# Patient Record
Sex: Female | Born: 1970 | Hispanic: Yes | Marital: Single | Smoking: Never smoker
Health system: Southern US, Community
[De-identification: ages and names within clinical notes are randomized; demographics above are authoritative.]

## PROBLEM LIST (undated history)

## (undated) DIAGNOSIS — E7800 Pure hypercholesterolemia, unspecified: Secondary | ICD-10-CM

## (undated) DIAGNOSIS — J302 Other seasonal allergic rhinitis: Secondary | ICD-10-CM

## (undated) HISTORY — PX: EYE SURGERY: SHX253

---

## 2019-01-22 ENCOUNTER — Ambulatory Visit: Payer: Self-pay | Attending: Nurse Practitioner | Admitting: Nurse Practitioner

## 2019-01-22 ENCOUNTER — Other Ambulatory Visit: Payer: Self-pay

## 2019-01-22 NOTE — Progress Notes (Signed)
1347hrs Nurse called the patient's home phone number through the interpreter service, interpreter [phone] but received no answer and message was left on the voicemail for the patient to call back.  Return phone number given.       1357hrs Nurse called the patient's home phone number through the interpreter service, interpreter [phone] but received no answer and message was left on the voicemail for the patient to call back.  Return phone number given.

## 2019-01-23 NOTE — Progress Notes (Signed)
This encounter was created in error - please disregard.

## 2020-02-03 DIAGNOSIS — F32A Depression, unspecified: Secondary | ICD-10-CM | POA: Insufficient documentation

## 2020-06-05 DIAGNOSIS — Z780 Asymptomatic menopausal state: Secondary | ICD-10-CM | POA: Insufficient documentation

## 2020-06-05 DIAGNOSIS — F418 Other specified anxiety disorders: Secondary | ICD-10-CM | POA: Insufficient documentation

## 2020-06-05 DIAGNOSIS — E785 Hyperlipidemia, unspecified: Secondary | ICD-10-CM | POA: Insufficient documentation

## 2020-08-21 ENCOUNTER — Ambulatory Visit: Payer: Self-pay | Admitting: Physician Assistant

## 2020-09-11 ENCOUNTER — Ambulatory Visit: Payer: Self-pay | Admitting: Family Medicine

## 2020-10-07 ENCOUNTER — Ambulatory Visit: Payer: Self-pay | Admitting: Critical Care Medicine

## 2020-10-07 NOTE — Progress Notes (Deleted)
   Subjective:    Patient ID: Nancy Melton, female    DOB: November 09, 1970, 50 y.o.   MRN: [id number]  50 y.o.M here to est PCP      Review of Systems     Objective:   Physical Exam        Assessment & Plan:

## 2020-12-16 ENCOUNTER — Other Ambulatory Visit: Payer: Self-pay | Admitting: Physician Assistant

## 2020-12-16 DIAGNOSIS — Z1231 Encounter for screening mammogram for malignant neoplasm of breast: Secondary | ICD-10-CM

## 2020-12-17 ENCOUNTER — Other Ambulatory Visit: Payer: Self-pay

## 2020-12-17 ENCOUNTER — Other Ambulatory Visit: Payer: Self-pay | Admitting: Physician Assistant

## 2020-12-17 DIAGNOSIS — Z1231 Encounter for screening mammogram for malignant neoplasm of breast: Secondary | ICD-10-CM

## 2020-12-18 ENCOUNTER — Other Ambulatory Visit: Payer: Self-pay | Admitting: *Deleted

## 2020-12-18 DIAGNOSIS — Z1231 Encounter for screening mammogram for malignant neoplasm of breast: Secondary | ICD-10-CM

## 2021-06-17 ENCOUNTER — Other Ambulatory Visit: Payer: Self-pay | Admitting: Physician Assistant

## 2021-06-17 DIAGNOSIS — Z1231 Encounter for screening mammogram for malignant neoplasm of breast: Secondary | ICD-10-CM

## 2021-07-07 ENCOUNTER — Other Ambulatory Visit: Payer: Self-pay

## 2021-07-07 ENCOUNTER — Ambulatory Visit
Admission: RE | Admit: 2021-07-07 | Discharge: 2021-07-07 | Disposition: A | Discharge status: Home / Self Care | Payer: No Typology Code available for payment source | Source: Ambulatory Visit | Attending: Physician Assistant | Admitting: Physician Assistant

## 2021-07-07 DIAGNOSIS — Z1231 Encounter for screening mammogram for malignant neoplasm of breast: Secondary | ICD-10-CM

## 2021-07-09 ENCOUNTER — Other Ambulatory Visit: Payer: Self-pay | Admitting: Obstetrics and Gynecology

## 2021-07-09 DIAGNOSIS — R928 Other abnormal and inconclusive findings on diagnostic imaging of breast: Secondary | ICD-10-CM

## 2021-07-28 ENCOUNTER — Ambulatory Visit: Payer: Self-pay | Admitting: *Deleted

## 2021-07-28 ENCOUNTER — Other Ambulatory Visit: Payer: Self-pay | Admitting: Obstetrics and Gynecology

## 2021-07-28 ENCOUNTER — Ambulatory Visit
Admission: RE | Admit: 2021-07-28 | Discharge: 2021-07-28 | Disposition: A | Discharge status: Home / Self Care | Payer: No Typology Code available for payment source | Source: Ambulatory Visit | Attending: Obstetrics and Gynecology | Admitting: Obstetrics and Gynecology

## 2021-07-28 ENCOUNTER — Other Ambulatory Visit: Payer: Self-pay

## 2021-07-28 VITALS — BP 122/70 | Wt 164.2 lb

## 2021-07-28 DIAGNOSIS — R928 Other abnormal and inconclusive findings on diagnostic imaging of breast: Secondary | ICD-10-CM

## 2021-07-28 DIAGNOSIS — N6320 Unspecified lump in the left breast, unspecified quadrant: Secondary | ICD-10-CM

## 2021-07-28 DIAGNOSIS — Z1211 Encounter for screening for malignant neoplasm of colon: Secondary | ICD-10-CM

## 2021-07-28 DIAGNOSIS — Z01419 Encounter for gynecological examination (general) (routine) without abnormal findings: Secondary | ICD-10-CM

## 2021-07-28 NOTE — Progress Notes (Signed)
[name] is a 50 y.o. No obstetric history on file. female who presents to [facility] clinic today with complaint of left upper breast pain x 1 year that occurs 1-2 times per month. Patient rates the pain at a 4 out of 10. Patient had a screening mammogram completed 07/07/2021 that additional imaging of the left breast is recommended for follow-up.    Pap Smear: Pap smear completed today. Last Pap smear was 4 years ago at [facility] clinic and was normal per patient. Per patient has no history of an abnormal Pap smear. Last Pap smear result is not available in Epic.   Physical exam: Breasts Breasts symmetrical. No skin abnormalities bilateral breasts. No nipple retraction bilateral breasts. No nipple discharge bilateral breasts. No lymphadenopathy. No lumps palpated bilateral breasts. No complaints of pain or tenderness on exam.    MM 3D SCREEN BREAST BILATERAL  Result Date: 07/08/2021 CLINICAL DATA:  Screening. EXAM: DIGITAL SCREENING BILATERAL MAMMOGRAM WITH TOMOSYNTHESIS AND CAD TECHNIQUE: Bilateral screening digital craniocaudal and mediolateral oblique mammograms were obtained. Bilateral screening digital breast tomosynthesis was performed. The images were evaluated with computer-aided detection. COMPARISON:  None. ACR Breast Density Category b: There are scattered areas of fibroglandular density. FINDINGS: In the left breast, possible masses warrants further evaluation. In the right breast, no findings suspicious for malignancy. IMPRESSION: Further evaluation is suggested for possible masses in the left breast. RECOMMENDATION: Diagnostic mammogram and possibly ultrasound of the left breast. (Code:[id number]) The patient will be contacted regarding the findings, and additional imaging will be scheduled. BI-RADS CATEGORY  0: Incomplete. Need additional imaging evaluation and/or prior mammograms for comparison. Electronically Signed   By: Nancy Melton M.D.   On:  07/08/2021 11:37    Pelvic/Bimanual Ext Genitalia No lesions, no swelling and no discharge observed on external genitalia.        Vagina Vagina pink and normal texture. No lesions or discharge observed in vagina.        Cervix Cervix is present. Cervix pink and of normal texture. No discharge observed.    Uterus Uterus is present and palpable. Uterus in normal position and normal size.        Adnexae Bilateral ovaries present and palpable. No tenderness on palpation.         Rectovaginal No rectal exam completed today since patient had no rectal complaints. No skin abnormalities observed on exam.     Smoking History: Patient has never smoked.   Patient Navigation: Patient education provided. Access to services provided for patient through [place] program. Spanish interpreter Nancy Melton from [facility] provided.   Colorectal Cancer Screening: Per patient has never had colonoscopy completed. FIT Test given to patient to complete. No complaints today.    Breast and Cervical Cancer Risk Assessment: Patient does not have family history of breast cancer, known genetic mutations, or radiation treatment to the chest before age 17. Patient does not have history of cervical dysplasia, immunocompromised, or DES exposure in-utero.  Risk Assessment     Risk Scores       07/28/2021   Last edited by: Nancy Rutherford, LPN   5-year risk: 0.8 %   Lifetime risk: 7.8 %            A: BCCCP exam with pap smear Complaint of left breast pain.  P: Referred patient to the [facility] Center of [facility] for a left breast diagnostic mammogram per recommendation. Appointment scheduled Tuesday, July 28, 2021 at 1450.  Nancy Melton, Nancy Grippe,  RN 07/28/2021 12:04 PM

## 2021-07-28 NOTE — Patient Instructions (Addendum)
Explained breast self awareness with Blinda Leatherwood. Pap smear completed today. Let her know BCCCP will cover Pap smears and HPV typing every 5 years unless has a history of abnormal Pap smears. Referred patient to the [facility] of [facility] for a left breast diagnostic mammogram per recommendation. Appointment scheduled Tuesday, July 28, 2021 at 1450. Patient aware of appointment and will be there. Let patient know will follow up with her within the next couple weeks with results of Pap smear by phone. Presley Blasdell verbalized understanding.  Rickita Forstner, Kathaleen Maser, RN 12:04 PM

## 2021-08-03 LAB — CYTOLOGY - PAP
Comment: NEGATIVE
Diagnosis: NEGATIVE
High risk HPV: NEGATIVE

## 2021-08-05 ENCOUNTER — Telehealth: Payer: Self-pay

## 2021-08-05 ENCOUNTER — Ambulatory Visit: Payer: Self-pay

## 2021-08-05 NOTE — Telephone Encounter (Signed)
Via Erika McReynolds, Spanish Interpreter ([employer]), Patient informed negative Pap/HPV results, next pap due in 5 years. Patient verbalized understanding.  

## 2021-08-06 ENCOUNTER — Ambulatory Visit
Admission: RE | Admit: 2021-08-06 | Discharge: 2021-08-06 | Disposition: A | Discharge status: Home / Self Care | Payer: No Typology Code available for payment source | Source: Ambulatory Visit | Attending: Obstetrics and Gynecology | Admitting: Obstetrics and Gynecology

## 2021-08-06 DIAGNOSIS — N6320 Unspecified lump in the left breast, unspecified quadrant: Secondary | ICD-10-CM

## 2021-08-06 HISTORY — PX: BREAST BIOPSY: SHX20

## 2021-08-27 ENCOUNTER — Ambulatory Visit: Payer: No Typology Code available for payment source

## 2021-10-28 ENCOUNTER — Ambulatory Visit: Payer: Self-pay | Admitting: Podiatry

## 2021-10-29 ENCOUNTER — Other Ambulatory Visit: Payer: Self-pay

## 2021-10-29 ENCOUNTER — Encounter: Payer: Self-pay | Admitting: Podiatry

## 2021-10-29 ENCOUNTER — Ambulatory Visit (INDEPENDENT_AMBULATORY_CARE_PROVIDER_SITE_OTHER): Payer: Self-pay | Admitting: Podiatry

## 2021-10-29 ENCOUNTER — Ambulatory Visit (INDEPENDENT_AMBULATORY_CARE_PROVIDER_SITE_OTHER): Payer: Self-pay

## 2021-10-29 DIAGNOSIS — M7750 Other enthesopathy of unspecified foot: Secondary | ICD-10-CM

## 2021-10-29 DIAGNOSIS — M722 Plantar fascial fibromatosis: Secondary | ICD-10-CM

## 2021-10-29 MED ORDER — MELOXICAM 15 MG PO TABS: 15.0000 mg | ORAL_TABLET | Freq: Every day | ORAL | 3 refills | Status: DC

## 2021-10-29 NOTE — Progress Notes (Signed)
°  Subjective:  Patient ID: Nancy Melton, female    DOB: [birth year],  MRN: [id number]  Chief Complaint  Patient presents with   Foot Pain       (np) right foot pain while walking*self pay    51 y.o. female presents with the above complaint. History confirmed with patient.  Started about 2 months ago its been in the arch and heel.  She works on her feet and this has made it worse.  The left just are not hurting a week or 2 ago because she is offloading onto the other foot.  She is taken naproxen but it did not help.  No professional treatment thus far  Objective:  Physical Exam: warm, good capillary refill, no trophic changes or ulcerative lesions, normal DP and PT pulses, and normal sensory exam. Left Foot: point tenderness over the heel pad Right Foot: point tenderness over the heel pad  No images are attached to the encounter.  Radiographs: Multiple views x-ray of the right foot: no fracture, dislocation, swelling or degenerative changes noted and plantar calcaneal spur Assessment:   1. Plantar fasciitis, bilateral      Plan:  Patient was evaluated and treated and all questions answered.  Discussed the etiology and treatment options for plantar fasciitis including stretching, formal physical therapy, supportive shoegears such as a running shoe or sneaker, pre fabricated orthoses, injection therapy, and oral medications. We also discussed the role of surgical treatment of this for patients who do not improve after exhausting non-surgical treatment options.   -XR reviewed with patient -Educated patient on stretching and icing of the affected limb -Injection delivered to the plantar fascia of the right foot. -Rx for meloxicam. Educated on use, risks and benefits of the medication -Advised if not improved by late April or early May to return for further injection and treatment  Return if symptoms worsen or fail to improve.

## 2021-10-29 NOTE — Patient Instructions (Addendum)

## 2021-11-09 DIAGNOSIS — M722 Plantar fascial fibromatosis: Secondary | ICD-10-CM | POA: Insufficient documentation

## 2022-06-24 ENCOUNTER — Other Ambulatory Visit: Payer: Self-pay

## 2022-06-24 DIAGNOSIS — Z1231 Encounter for screening mammogram for malignant neoplasm of breast: Secondary | ICD-10-CM

## 2022-08-03 ENCOUNTER — Ambulatory Visit: Payer: Self-pay | Admitting: *Deleted

## 2022-08-03 ENCOUNTER — Ambulatory Visit
Admission: RE | Admit: 2022-08-03 | Discharge: 2022-08-03 | Disposition: A | Discharge status: Home / Self Care | Payer: No Typology Code available for payment source | Source: Ambulatory Visit | Attending: Obstetrics and Gynecology | Admitting: Obstetrics and Gynecology

## 2022-08-03 VITALS — BP 119/80 | Ht 63.0 in | Wt 149.0 lb

## 2022-08-03 DIAGNOSIS — Z1239 Encounter for other screening for malignant neoplasm of breast: Secondary | ICD-10-CM

## 2022-08-03 DIAGNOSIS — Z1231 Encounter for screening mammogram for malignant neoplasm of breast: Secondary | ICD-10-CM

## 2022-08-03 DIAGNOSIS — Z1211 Encounter for screening for malignant neoplasm of colon: Secondary | ICD-10-CM

## 2022-08-03 NOTE — Patient Instructions (Signed)
Explained breast self awareness with Blinda Leatherwood. Patient did not need a Pap smear today due to last Pap smear and HPV typing was 07/28/2021. Let her know BCCCP will cover Pap smears and HPV typing every 5 years unless has a history of abnormal Pap smears. Referred patient to the Breast Center of [facility] for a screening mammogram on mobile unit. Appointment scheduled Tuesday, August 03, 2022 at 1500. Patient aware of appointment and will be there. Let patient know the Breast Center will follow up with her within the next couple weeks with results of her mammogram by letter or phone. Aren [place] verbalized understanding.  Tashan Kreitzer, Kathaleen Maser, RN 2:59 PM

## 2022-08-03 NOTE — Progress Notes (Signed)
Nancy Melton is a 51 y.o. female who presents to [facility] clinic today with no complaints.    Pap Smear: Pap smear not completed today. Last Pap smear was 07/28/2021 at [facility] clinic and was normal with negative HPV. Per patient has no history of an abnormal Pap smear. Last Pap smear result is available in Epic.   Physical exam: Breasts Breasts symmetrical. No skin abnormalities bilateral breasts. No nipple retraction bilateral breasts. No nipple discharge bilateral breasts. No lymphadenopathy. No lumps palpated bilateral breasts. No complaints of pain or tenderness on exam.     MM CLIP PLACEMENT LEFT  Result Date: 08/06/2021 CLINICAL DATA:  Evaluate biopsy marker EXAM: 3D DIAGNOSTIC LEFT MAMMOGRAM POST ULTRASOUND BIOPSY COMPARISON:  Previous exam(s). FINDINGS: 3D Mammographic images were obtained following ultrasound guided biopsy of an 11 o'clock left breast mass. The biopsy marking clip is in expected position at the site of biopsy. IMPRESSION: Appropriate positioning of the ribbon shaped biopsy marking clip at the site of biopsy in the biopsied 11 o'clock left breast mass. Final Assessment: Post Procedure Mammograms for Marker Placement Electronically Signed   By: Gerome Sam III M.D.   On: 08/06/2021 10:47  MM DIAG BREAST TOMO UNI LEFT  Result Date: 07/28/2021 CLINICAL DATA:  Patient returns today to evaluate 2 possible LEFT breast masses identified on a recent baseline screening mammogram. EXAM: DIGITAL DIAGNOSTIC UNILATERAL LEFT MAMMOGRAM WITH TOMOSYNTHESIS AND CAD; ULTRASOUND LEFT BREAST LIMITED TECHNIQUE: Left digital diagnostic mammography and breast tomosynthesis was performed. The images were evaluated with computer-aided detection.; Targeted ultrasound examination of the left breast was performed. COMPARISON:  Baseline screening mammogram dated 07/07/2021. ACR Breast Density Category b: There are scattered areas of fibroglandular density. FINDINGS: On today's additional  diagnostic views with spot compression and 3D tomosynthesis, an oval circumscribed mass is confirmed within the inner LEFT breast which measures approximately 1 cm greatest dimension. There is an additional partially obscured mass confirmed within the outer LEFT breast which measures approximately 8 mm greatest dimension. Targeted ultrasound is performed, showing an irregular hypoechoic mass in the LEFT breast at the 11 o'clock axis, 3 cm from the nipple, measuring 1.3 x 0.5 x 0.5 cm, without internal vascularity, corresponding to the mammographic finding. Targeted ultrasound also shows a benign cluster of cysts in the LEFT breast at the 3 o'clock axis, 3 cm from the nipple, measuring 6 x 4 x 5 mm, corresponding to the other mammographic finding. LEFT axilla was also evaluated with ultrasound showing no enlarged or morphologically abnormal lymph nodes. IMPRESSION: 1. Indeterminate hypoechoic mass in the LEFT breast at the 11 o'clock axis, 3 cm from nipple, measuring 1.3 cm, corresponding to the mammographic findings. This may represent a benign cluster of complicated cysts. Ultrasound-guided biopsy is recommended to exclude malignancy. 2. Benign cluster of cysts in the LEFT breast at the 3 o'clock axis, 3 cm from the nipple, measuring 6 mm, corresponding to the other mammographic finding. RECOMMENDATION: Ultrasound-guided biopsy for the LEFT breast mass at the 11 o'clock axis, 3 cm from the nipple, measuring 1.3 cm. Ultrasound-guided biopsy is scheduled for December 1st. I have discussed the findings and recommendations with the patient with the aid of an interpreter. If applicable, a reminder letter will be sent to the patient regarding the next appointment. BI-RADS CATEGORY  4: Suspicious. Electronically Signed   By: Bary Richard M.D.   On: 07/28/2021 16:11  MM 3D SCREEN BREAST BILATERAL  Result Date: 07/08/2021 CLINICAL DATA:  Screening. EXAM: DIGITAL SCREENING BILATERAL MAMMOGRAM WITH  TOMOSYNTHESIS AND CAD  TECHNIQUE: Bilateral screening digital craniocaudal and mediolateral oblique mammograms were obtained. Bilateral screening digital breast tomosynthesis was performed. The images were evaluated with computer-aided detection. COMPARISON:  None. ACR Breast Density Category b: There are scattered areas of fibroglandular density. FINDINGS: In the left breast, possible masses warrants further evaluation. In the right breast, no findings suspicious for malignancy. IMPRESSION: Further evaluation is suggested for possible masses in the left breast. RECOMMENDATION: Diagnostic mammogram and possibly ultrasound of the left breast. (Code:[id number]) The patient will be contacted regarding the findings, and additional imaging will be scheduled. BI-RADS CATEGORY  0: Incomplete. Need additional imaging evaluation and/or prior mammograms for comparison. Electronically Signed   By: Baird Lyons M.D.   On: 07/08/2021 11:37        Pelvic/Bimanual Pap is not indicated today per BCCCP guidelines.   Smoking History: Patient has never smoked.   Patient Navigation: Patient education provided. Access to services provided for patient through [place] program. Spanish interpreter [name] from [facility] provided.   Colorectal Cancer Screening: Per patient has never had colonoscopy completed. FIT Test given to patient to complete. No complaints today.    Breast and Cervical Cancer Risk Assessment: Patient does not have family history of breast cancer, known genetic mutations, or radiation treatment to the chest before age 20. Patient does not have history of cervical dysplasia, immunocompromised, or DES exposure in-utero.  Risk Scores as of 08/03/2022     [name]           5-year 1.18 %   Lifetime 10.82 %   This patient is [place] but has no documented birth country, so the [place] model used data from [place] patients to calculate their risk score. Document a birth country in the Demographics activity for a more accurate  score.         Last calculated by Caprice Red, CMA on 08/03/2022 at  2:57 PM        A: BCCCP exam without pap smear No complaints.  P: Referred patient to the [facility] Center of [facility] for a screening mammogram on mobile unit. Appointment scheduled Tuesday, August 03, 2022 at 1500.  Priscille Heidelberg, RN 08/03/2022 2:59 PM

## 2022-08-05 ENCOUNTER — Other Ambulatory Visit: Payer: Self-pay | Admitting: Obstetrics and Gynecology

## 2022-08-05 DIAGNOSIS — R928 Other abnormal and inconclusive findings on diagnostic imaging of breast: Secondary | ICD-10-CM

## 2022-08-16 ENCOUNTER — Other Ambulatory Visit: Payer: No Typology Code available for payment source

## 2022-08-26 ENCOUNTER — Other Ambulatory Visit: Payer: Self-pay | Admitting: Obstetrics and Gynecology

## 2022-08-26 ENCOUNTER — Ambulatory Visit
Admission: RE | Admit: 2022-08-26 | Discharge: 2022-08-26 | Disposition: A | Discharge status: Home / Self Care | Payer: No Typology Code available for payment source | Source: Ambulatory Visit | Attending: Obstetrics and Gynecology | Admitting: Obstetrics and Gynecology

## 2022-08-26 DIAGNOSIS — R928 Other abnormal and inconclusive findings on diagnostic imaging of breast: Secondary | ICD-10-CM

## 2022-09-08 ENCOUNTER — Ambulatory Visit
Admission: RE | Admit: 2022-09-08 | Discharge: 2022-09-08 | Disposition: A | Discharge status: Home / Self Care | Payer: No Typology Code available for payment source | Source: Ambulatory Visit | Attending: Obstetrics and Gynecology | Admitting: Obstetrics and Gynecology

## 2022-09-08 DIAGNOSIS — R928 Other abnormal and inconclusive findings on diagnostic imaging of breast: Secondary | ICD-10-CM

## 2022-09-08 HISTORY — PX: BREAST BIOPSY: SHX20

## 2022-09-22 ENCOUNTER — Ambulatory Visit: Payer: Self-pay | Admitting: Surgery

## 2022-09-22 DIAGNOSIS — N6489 Other specified disorders of breast: Secondary | ICD-10-CM

## 2022-10-05 ENCOUNTER — Other Ambulatory Visit: Payer: Self-pay | Admitting: Surgery

## 2022-10-05 DIAGNOSIS — N6489 Other specified disorders of breast: Secondary | ICD-10-CM

## 2022-12-06 ENCOUNTER — Encounter (HOSPITAL_BASED_OUTPATIENT_CLINIC_OR_DEPARTMENT_OTHER): Payer: Self-pay | Admitting: Surgery

## 2022-12-06 ENCOUNTER — Other Ambulatory Visit: Payer: Self-pay

## 2022-12-09 NOTE — Progress Notes (Signed)

## 2022-12-13 ENCOUNTER — Ambulatory Visit
Admission: RE | Admit: 2022-12-13 | Discharge: 2022-12-13 | Disposition: A | Discharge status: Home / Self Care | Payer: No Typology Code available for payment source | Source: Ambulatory Visit | Attending: Surgery | Admitting: Surgery

## 2022-12-13 DIAGNOSIS — N6489 Other specified disorders of breast: Secondary | ICD-10-CM

## 2022-12-13 HISTORY — PX: BREAST BIOPSY: SHX20

## 2022-12-14 ENCOUNTER — Other Ambulatory Visit: Payer: Self-pay

## 2022-12-14 ENCOUNTER — Ambulatory Visit
Admission: RE | Admit: 2022-12-14 | Discharge: 2022-12-14 | Disposition: A | Discharge status: Home / Self Care | Payer: No Typology Code available for payment source | Source: Ambulatory Visit | Attending: Surgery | Admitting: Surgery

## 2022-12-14 ENCOUNTER — Encounter (HOSPITAL_BASED_OUTPATIENT_CLINIC_OR_DEPARTMENT_OTHER)
Admission: RE | Disposition: A | Discharge status: Home / Self Care | Payer: Self-pay | Source: Home / Self Care | Attending: Surgery

## 2022-12-14 ENCOUNTER — Ambulatory Visit (HOSPITAL_BASED_OUTPATIENT_CLINIC_OR_DEPARTMENT_OTHER): Payer: No Typology Code available for payment source | Admitting: Anesthesiology

## 2022-12-14 ENCOUNTER — Ambulatory Visit (HOSPITAL_BASED_OUTPATIENT_CLINIC_OR_DEPARTMENT_OTHER)
Admission: RE | Admit: 2022-12-14 | Discharge: 2022-12-14 | Disposition: A | Discharge status: Home / Self Care | Payer: No Typology Code available for payment source | Attending: Surgery | Admitting: Surgery

## 2022-12-14 DIAGNOSIS — F32A Depression, unspecified: Secondary | ICD-10-CM | POA: Insufficient documentation

## 2022-12-14 DIAGNOSIS — L905 Scar conditions and fibrosis of skin: Secondary | ICD-10-CM | POA: Insufficient documentation

## 2022-12-14 DIAGNOSIS — N6489 Other specified disorders of breast: Secondary | ICD-10-CM

## 2022-12-14 DIAGNOSIS — F419 Anxiety disorder, unspecified: Secondary | ICD-10-CM | POA: Insufficient documentation

## 2022-12-14 DIAGNOSIS — E7800 Pure hypercholesterolemia, unspecified: Secondary | ICD-10-CM | POA: Insufficient documentation

## 2022-12-14 HISTORY — DX: Other seasonal allergic rhinitis: J30.2

## 2022-12-14 HISTORY — DX: Pure hypercholesterolemia, unspecified: E78.00

## 2022-12-14 HISTORY — PX: BREAST LUMPECTOMY WITH RADIOACTIVE SEED LOCALIZATION: SHX6424

## 2022-12-14 SURGERY — BREAST LUMPECTOMY WITH RADIOACTIVE SEED LOCALIZATION
Anesthesia: General | Site: Breast | Laterality: Right

## 2022-12-14 MED ORDER — ACETAMINOPHEN 500 MG PO TABS
ORAL_TABLET | ORAL | Status: AC
  Filled 2022-12-14: qty 2

## 2022-12-14 MED ORDER — BUPIVACAINE HCL (PF) 0.25 % IJ SOLN
INTRAMUSCULAR | Status: DC | PRN
  Administered 2022-12-14: 20 mL

## 2022-12-14 MED ORDER — MIDAZOLAM HCL 2 MG/2ML IJ SOLN
INTRAMUSCULAR | Status: AC
  Filled 2022-12-14: qty 2

## 2022-12-14 MED ORDER — LIDOCAINE HCL (CARDIAC) PF 100 MG/5ML IV SOSY
PREFILLED_SYRINGE | INTRAVENOUS | Status: DC | PRN
  Administered 2022-12-14: 60 mg via INTRATRACHEAL

## 2022-12-14 MED ORDER — MIDAZOLAM HCL 5 MG/5ML IJ SOLN
INTRAMUSCULAR | Status: DC | PRN
  Administered 2022-12-14: 2 mg via INTRAVENOUS

## 2022-12-14 MED ORDER — LIDOCAINE 2% (20 MG/ML) 5 ML SYRINGE
INTRAMUSCULAR | Status: AC
  Filled 2022-12-14: qty 5

## 2022-12-14 MED ORDER — BUPIVACAINE HCL (PF) 0.25 % IJ SOLN
INTRAMUSCULAR | Status: AC
  Filled 2022-12-14: qty 120

## 2022-12-14 MED ORDER — HYDROMORPHONE HCL 1 MG/ML IJ SOLN: 0.2500 mg | INTRAMUSCULAR | Status: DC | PRN

## 2022-12-14 MED ORDER — KETOROLAC TROMETHAMINE 30 MG/ML IJ SOLN
INTRAMUSCULAR | Status: DC | PRN
  Administered 2022-12-14: 30 mg via INTRAVENOUS

## 2022-12-14 MED ORDER — ONDANSETRON HCL 4 MG/2ML IJ SOLN
INTRAMUSCULAR | Status: AC
  Filled 2022-12-14: qty 2

## 2022-12-14 MED ORDER — ONDANSETRON HCL 4 MG/2ML IJ SOLN
INTRAMUSCULAR | Status: DC | PRN
  Administered 2022-12-14: 4 mg via INTRAVENOUS

## 2022-12-14 MED ORDER — FENTANYL CITRATE (PF) 100 MCG/2ML IJ SOLN
INTRAMUSCULAR | Status: AC
  Filled 2022-12-14: qty 2

## 2022-12-14 MED ORDER — LACTATED RINGERS IV SOLN: INTRAVENOUS | Status: DC

## 2022-12-14 MED ORDER — SODIUM CHLORIDE 0.9 % IV SOLN
INTRAVENOUS | Status: AC
  Filled 2022-12-14 (×3): qty 10

## 2022-12-14 MED ORDER — DEXAMETHASONE SODIUM PHOSPHATE 10 MG/ML IJ SOLN
INTRAMUSCULAR | Status: AC
  Filled 2022-12-14: qty 1

## 2022-12-14 MED ORDER — SODIUM CHLORIDE 0.9 % IV SOLN
INTRAVENOUS | Status: DC | PRN
  Administered 2022-12-14: 500 mL

## 2022-12-14 MED ORDER — CEFAZOLIN SODIUM-DEXTROSE 2-4 GM/100ML-% IV SOLN
INTRAVENOUS | Status: AC
  Filled 2022-12-14: qty 100

## 2022-12-14 MED ORDER — ACETAMINOPHEN 500 MG PO TABS
1000.0000 mg | ORAL_TABLET | ORAL | Status: AC
  Administered 2022-12-14: 1000 mg via ORAL

## 2022-12-14 MED ORDER — PROPOFOL 10 MG/ML IV BOLUS
INTRAVENOUS | Status: AC
  Filled 2022-12-14: qty 20

## 2022-12-14 MED ORDER — FENTANYL CITRATE (PF) 100 MCG/2ML IJ SOLN
INTRAMUSCULAR | Status: DC | PRN
  Administered 2022-12-14: 25 ug via INTRAVENOUS
  Administered 2022-12-14: 50 ug via INTRAVENOUS
  Administered 2022-12-14: 25 ug via INTRAVENOUS

## 2022-12-14 MED ORDER — PROPOFOL 10 MG/ML IV BOLUS
INTRAVENOUS | Status: DC | PRN
  Administered 2022-12-14: 150 mg via INTRAVENOUS

## 2022-12-14 MED ORDER — CHLORHEXIDINE GLUCONATE CLOTH 2 % EX PADS: 6.0000 | MEDICATED_PAD | Freq: Once | CUTANEOUS | Status: DC

## 2022-12-14 MED ORDER — BUPIVACAINE-EPINEPHRINE (PF) 0.25% -1:200000 IJ SOLN: INTRAMUSCULAR | Status: DC | PRN

## 2022-12-14 MED ORDER — DEXAMETHASONE SODIUM PHOSPHATE 10 MG/ML IJ SOLN
INTRAMUSCULAR | Status: DC | PRN
  Administered 2022-12-14: 4 mg via INTRAVENOUS

## 2022-12-14 MED ORDER — CEFAZOLIN SODIUM-DEXTROSE 2-4 GM/100ML-% IV SOLN
2.0000 g | INTRAVENOUS | Status: AC
  Administered 2022-12-14: 2 g via INTRAVENOUS

## 2022-12-14 SURGICAL SUPPLY — 54 items
ADH SKN CLS APL DERMABOND .7 (GAUZE/BANDAGES/DRESSINGS) ×1
APL PRP STRL LF DISP 70% ISPRP (MISCELLANEOUS) ×1
APPLIER CLIP 9.375 MED OPEN (MISCELLANEOUS)
APR CLP MED 9.3 20 MLT OPN (MISCELLANEOUS)
BINDER BREAST LRG (GAUZE/BANDAGES/DRESSINGS) IMPLANT
BINDER BREAST MEDIUM (GAUZE/BANDAGES/DRESSINGS) IMPLANT
BINDER BREAST XLRG (GAUZE/BANDAGES/DRESSINGS) IMPLANT
BINDER BREAST XXLRG (GAUZE/BANDAGES/DRESSINGS) IMPLANT
BLADE SURG 15 STRL LF DISP TIS (BLADE) ×1 IMPLANT
BLADE SURG 15 STRL SS (BLADE) ×1
CANISTER SUC SOCK COL 7IN (MISCELLANEOUS) IMPLANT
CANISTER SUCT 1200ML W/VALVE (MISCELLANEOUS) IMPLANT
CHLORAPREP W/TINT 26 (MISCELLANEOUS) ×1 IMPLANT
CLIP APPLIE 9.375 MED OPEN (MISCELLANEOUS) IMPLANT
COVER BACK TABLE 60X90IN (DRAPES) ×1 IMPLANT
COVER MAYO STAND STRL (DRAPES) ×1 IMPLANT
COVER PROBE CYLINDRICAL 5X96 (MISCELLANEOUS) ×1 IMPLANT
DERMABOND ADVANCED .7 DNX12 (GAUZE/BANDAGES/DRESSINGS) ×1 IMPLANT
DRAPE LAPAROSCOPIC ABDOMINAL (DRAPES) IMPLANT
DRAPE LAPAROTOMY 100X72 PEDS (DRAPES) ×1 IMPLANT
DRAPE UTILITY XL STRL (DRAPES) ×1 IMPLANT
ELECT COATED BLADE 2.86 ST (ELECTRODE) ×1 IMPLANT
ELECT REM PT RETURN 9FT ADLT (ELECTROSURGICAL) ×1
ELECTRODE REM PT RTRN 9FT ADLT (ELECTROSURGICAL) ×1 IMPLANT
GLOVE BIOGEL PI IND STRL 6.5 (GLOVE) IMPLANT
GLOVE BIOGEL PI IND STRL 7.0 (GLOVE) IMPLANT
GLOVE BIOGEL PI IND STRL 8 (GLOVE) ×1 IMPLANT
GLOVE ECLIPSE 8.0 STRL XLNG CF (GLOVE) ×1 IMPLANT
GLOVE SURG SS PI 6.5 STRL IVOR (GLOVE) IMPLANT
GLOVE SURG SYN 8.0 (GLOVE) ×1 IMPLANT
GLOVE SURG SYN 8.0 PF PI (GLOVE) IMPLANT
GOWN STRL REUS W/ TWL LRG LVL3 (GOWN DISPOSABLE) ×2 IMPLANT
GOWN STRL REUS W/ TWL XL LVL3 (GOWN DISPOSABLE) ×1 IMPLANT
GOWN STRL REUS W/TWL LRG LVL3 (GOWN DISPOSABLE) ×1
GOWN STRL REUS W/TWL XL LVL3 (GOWN DISPOSABLE) ×1
HEMOSTAT ARISTA ABSORB 3G PWDR (HEMOSTASIS) IMPLANT
HEMOSTAT SNOW SURGICEL 2X4 (HEMOSTASIS) IMPLANT
KIT MARKER MARGIN INK (KITS) ×1 IMPLANT
NDL HYPO 25X1 1.5 SAFETY (NEEDLE) ×1 IMPLANT
NEEDLE HYPO 25X1 1.5 SAFETY (NEEDLE) ×1 IMPLANT
NS IRRIG 1000ML POUR BTL (IV SOLUTION) ×1 IMPLANT
PACK BASIN DAY SURGERY FS (CUSTOM PROCEDURE TRAY) ×1 IMPLANT
PENCIL SMOKE EVACUATOR (MISCELLANEOUS) ×1 IMPLANT
SLEEVE SCD COMPRESS KNEE MED (STOCKING) ×1 IMPLANT
SPIKE FLUID TRANSFER (MISCELLANEOUS) IMPLANT
SPONGE T-LAP 4X18 ~~LOC~~+RFID (SPONGE) ×1 IMPLANT
SUT MNCRL AB 4-0 PS2 18 (SUTURE) ×1 IMPLANT
SUT SILK 2 0 SH (SUTURE) IMPLANT
SUT VICRYL 3-0 CR8 SH (SUTURE) ×1 IMPLANT
SYR CONTROL 10ML LL (SYRINGE) ×1 IMPLANT
TOWEL GREEN STERILE FF (TOWEL DISPOSABLE) ×1 IMPLANT
TRAY FAXITRON CT DISP (TRAY / TRAY PROCEDURE) ×1 IMPLANT
TUBE CONNECTING 20X1/4 (TUBING) IMPLANT
YANKAUER SUCT BULB TIP NO VENT (SUCTIONS) IMPLANT

## 2022-12-14 NOTE — Anesthesia Postprocedure Evaluation (Signed)
Anesthesia Post Note  Patient: Nancy Melton  Procedure(s) Performed: RIGHT BREAST LUMPECTOMY WITH RADIOACTIVE SEED LOCALIZATION (Right: Breast)     Patient location during evaluation: PACU Anesthesia Type: General Level of consciousness: awake and alert Pain management: pain level controlled Vital Signs Assessment: post-procedure vital signs reviewed and stable Respiratory status: spontaneous breathing, nonlabored ventilation and respiratory function stable Cardiovascular status: blood pressure returned to baseline and stable Postop Assessment: no apparent nausea or vomiting Anesthetic complications: no  No notable events documented.  Last Vitals:  Vitals:   12/14/22 1015 12/14/22 1030  BP: 123/75 130/64  Pulse: 76 71  Resp: 13 14  Temp:  (!) 36.3 C  SpO2: 97% 96%    Last Pain:  Vitals:   12/14/22 1030  TempSrc:   PainSc: 0-No pain                 Mellonie Guess,W. EDMOND

## 2022-12-14 NOTE — Discharge Instructions (Addendum)
[facility] Office Phone Number [phone]  BREAST BIOPSY/ PARTIAL MASTECTOMY: POST OP INSTRUCTIONS  Always review your discharge instruction sheet given to you by the facility where your surgery was performed.  IF YOU HAVE DISABILITY OR FAMILY LEAVE FORMS, YOU MUST BRING THEM TO THE OFFICE FOR PROCESSING.  DO NOT GIVE THEM TO YOUR DOCTOR.  A prescription for pain medication may be given to you upon discharge.  Take your pain medication as prescribed, if needed.  If narcotic pain medicine is not needed, then you may take acetaminophen (Tylenol) or ibuprofen (Advil) as needed. Take your usually prescribed medications unless otherwise directed If you need a refill on your pain medication, please contact your pharmacy.  They will contact our office to request authorization.  Prescriptions will not be filled after 5pm or on week-ends. You should eat very light the first 24 hours after surgery, such as soup, crackers, pudding, etc.  Resume your normal diet the day after surgery. Most patients will experience some swelling and bruising in the breast.  Ice packs and a good support bra will help.  Swelling and bruising can take several days to resolve.  It is common to experience some constipation if taking pain medication after surgery.  Increasing fluid intake and taking a stool softener will usually help or prevent this problem from occurring.  A mild laxative (Milk of Magnesia or Miralax) should be taken according to package directions if there are no bowel movements after 48 hours. Unless discharge instructions indicate otherwise, you may remove your bandages 24-48 hours after surgery, and you may shower at that time.  You may have steri-strips (small skin tapes) in place directly over the incision.  These strips should be left on the skin for 7-10 days.  If your surgeon used skin glue on the incision, you may shower in 24 hours.  The glue will flake off over the next 2-3 weeks.  Any  sutures or staples will be removed at the office during your follow-up visit. ACTIVITIES:  You may resume regular daily activities (gradually increasing) beginning the next day.  Wearing a good support bra or sports bra minimizes pain and swelling.  You may have sexual intercourse when it is comfortable. You may drive when you no longer are taking prescription pain medication, you can comfortably wear a seatbelt, and you can safely maneuver your car and apply brakes. RETURN TO WORK:  ______________________________________________________________________________________ Nancy Melton should see your doctor in the office for a follow-up appointment approximately two weeks after your surgery.  Your doctor's nurse will typically make your follow-up appointment when she calls you with your pathology report.  Expect your pathology report 2-3 business days after your surgery.  You may call to check if you do not hear from [place] after three days. OTHER INSTRUCTIONS: _______________________________________________________________________________________________ _____________________________________________________________________________________________________________________________________ _____________________________________________________________________________________________________________________________________ _____________________________________________________________________________________________________________________________________  WHEN TO CALL YOUR DOCTOR: Fever over 101.0 Nausea and/or vomiting. Extreme swelling or bruising. Continued bleeding from incision. Increased pain, redness, or drainage from the incision.  The clinic staff is available to answer your questions during regular business hours.  Please don't hesitate to call and ask to speak to one of the nurses for clinical concerns.  If you have a medical emergency, go to the nearest emergency room or call 911.  A surgeon from [facility] is always on call at the hospital.  For further questions, please visit [email or web address]    You may have Tylenol again after 1:30pm today, if needed.   You may have  Ibuprofen/NSAIDS after 5:30pm today, if needed.   Post Anesthesia Home Care Instructions  Activity: Get plenty of rest for the remainder of the day. A responsible individual must stay with you for 24 hours following the procedure.  For the next 24 hours, DO NOT: -Drive a car -Advertising copywriter -Drink alcoholic beverages -Take any medication unless instructed by your physician -Make any legal decisions or sign important papers.  Meals: Start with liquid foods such as gelatin or soup. Progress to regular foods as tolerated. Avoid greasy, spicy, heavy foods. If nausea and/or vomiting occur, drink only clear liquids until the nausea and/or vomiting subsides. Call your physician if vomiting continues.  Special Instructions/Symptoms: Your throat may feel dry or sore from the anesthesia or the breathing tube placed in your throat during surgery. If this causes discomfort, gargle with warm salt water. The discomfort should disappear within 24 hours.  If you had a scopolamine patch placed behind your ear for the management of post- operative nausea and/or vomiting:  1. The medication in the patch is effective for 72 hours, after which it should be removed.  Wrap patch in a tissue and discard in the trash. Wash hands thoroughly with soap and water. 2. You may remove the patch earlier than 72 hours if you experience unpleasant side effects which may include dry mouth, dizziness or visual disturbances. 3. Avoid touching the patch. Wash your hands with soap and water after contact with the patch.

## 2022-12-14 NOTE — Transfer of Care (Signed)
Immediate Anesthesia Transfer of Care Note  Patient: Nancy Melton  Procedure(s) Performed: RIGHT BREAST LUMPECTOMY WITH RADIOACTIVE SEED LOCALIZATION (Right: Breast)  Patient Location: PACU  Anesthesia Type:General  Level of Consciousness: drowsy and patient cooperative  Airway & Oxygen Therapy: Patient Spontanous Breathing and Patient connected to face mask oxygen  Post-op Assessment: Report given to RN and Post -op Vital signs reviewed and stable  Post vital signs: Reviewed and stable  Last Vitals:  Vitals Value Taken Time  BP    Temp    Pulse 82 12/14/22 0944  Resp    SpO2 98 % 12/14/22 0944  Vitals shown include unvalidated device data.  Last Pain:  Vitals:   12/14/22 0731  TempSrc: Oral  PainSc: 0-No pain      Patients Stated Pain Goal: 3 (12/14/22 0731)  Complications: No notable events documented.

## 2022-12-14 NOTE — Anesthesia Procedure Notes (Signed)
Procedure Name: LMA Insertion Date/Time: 12/14/2022 8:54 AM  Performed by: Thornell Mule, CRNAPre-anesthesia Checklist: Patient identified, Emergency Drugs available, Suction available and Patient being monitored Patient Re-evaluated:Patient Re-evaluated prior to induction Oxygen Delivery Method: Circle system utilized Preoxygenation: Pre-oxygenation with 100% oxygen Induction Type: IV induction LMA: LMA inserted LMA Size: 4.0 Number of attempts: 1 Placement Confirmation: positive ETCO2 Tube secured with: Tape Dental Injury: Teeth and Oropharynx as per pre-operative assessment

## 2022-12-14 NOTE — Interval H&P Note (Signed)
History and Physical Interval Note:  12/14/2022 8:36 AM  Nancy Melton  has presented today for surgery, with the diagnosis of RIGHT BREAST RADIAL SCAR.  The various methods of treatment have been discussed with the patient and family. After consideration of risks, benefits and other options for treatment, the patient has consented to  Procedure(s): RIGHT BREAST LUMPECTOMY WITH RADIOACTIVE SEED LOCALIZATION (Right) as a surgical intervention.  The patient's history has been reviewed, patient examined, no change in status, stable for surgery.  I have reviewed the patient's chart and labs.  Questions were answered to the patient's satisfaction.    The procedure has been discussed with the patient. Alternatives to surgery have been discussed with the patient.  Risks of surgery include bleeding,  Infection,  Seroma formation, death,  and the need for further surgery.   The patient understands and wishes to proceed.  [name] A Gradie Ohm

## 2022-12-14 NOTE — H&P (Signed)
Chief Complaint: New Consultation (Right breast complex sclerosing lesion)   History of Present Illness: Nancy Melton is a 52 y.o. female who is seen today as an office consultation for evaluation of New Consultation (Right breast complex sclerosing lesion) .  Patient presents for evaluation of abnormal right breast mammogram. Patient was noted on most screening mammogram to have a density right breast upper outer quadrant core biopsy proven to be radial scar. She presents today for discussion of lumpectomy for this finding on her mammogram. A translator is used today for communication purposes. The patient has no complaints of breast pain, breast mass or history of nipple discharge bilaterally.  Review of Systems: A complete review of systems was obtained from the patient. I have reviewed this information and discussed as appropriate with the patient. See HPI as well for other ROS.  Review of Systems All other systems reviewed and are negative.   Medical History: Past Medical History: Diagnosis Date Arrhythmia Hyperlipidemia  There is no problem list on file for this patient.  Past Surgical History: Procedure Laterality Date BREAST EXCISIONAL BIOPSY Right   Allergies Allergen Reactions Latex Hives  Current Outpatient Medications on File Prior to Visit Medication Sig Dispense Refill lisinopriL (ZESTRIL) 20 MG tablet Take 20 mg by mouth once daily  No current facility-administered medications on file prior to visit.  Family History Problem Relation Age of Onset Heart valve disease Brother   Social History  Tobacco Use Smoking Status Never Smokeless Tobacco Never   Social History  Socioeconomic History Marital status: Single Tobacco Use Smoking status: Never Smokeless tobacco: Never Substance and Sexual Activity Alcohol use: Never Drug use: Never  Objective:  Vitals: 09/22/22 1001 BP: 112/78 Pulse: 84 Temp: 36.9 C (98.4 F) SpO2:  98% Weight: 64 kg (141 lb) Height: 154.9 cm (5\' 1" )  Body mass index is 26.64 kg/m.  Physical Exam Exam conducted with a chaperone present. Chest: Breasts: Right: No mass or nipple discharge. Left: No mass or nipple discharge.  Comments: Bruising noted right breast  Large Musculoskeletal: General: Normal range of motion. Cervical back: Normal range of motion. Lymphadenopathy: Upper Body: Right upper body: No supraclavicular or axillary adenopathy. Left upper body: No supraclavicular or axillary adenopathy. Skin: General: Skin is warm. Neurological: General: No focal deficit present. Mental Status: She is alert.    Labs, Imaging and Diagnostic Testing:  Diagnosis Breast, right, needle core biopsy, UOQ - COMPLEX SCLEROSING LESION. - FOCAL CALCIFICATIONS PRESENT Microscopic Comment [facility] was informed of the diagnosis on 09/09/2022. Orene Desanctis DO Pathologist, Electronic Signature (Case signed 09/09/2022) Assessment and Plan:  Diagnoses and all orders for this visit:  Radial scar of right breast    Discussed surgery with the help of a translator today. Discussed the pros and cons of right breast lumpectomy in the circumstance especially with the potential upgrade risk of 10% or so. Discussed timing as well as postop care, expected return to recovery, complications and alternatives to surgery.The procedure has been discussed with the patient. Alternatives to surgery have been discussed with the patient. Risks of surgery include bleeding, Infection, Seroma formation, death, and the need for further surgery. The patient understands and wishes to proceed.  No follow-ups on file.  Hayden Rasmussen, MD

## 2022-12-14 NOTE — Anesthesia Preprocedure Evaluation (Addendum)
Anesthesia Evaluation  Patient identified by MRN, date of birth, ID band Patient awake    Reviewed: Allergy & Precautions, H&P , NPO status , Patient's Chart, lab work & pertinent test results  Airway Mallampati: III  TM Distance: >3 FB Neck ROM: Full    Dental no notable dental hx. (+) Teeth Intact, Dental Advisory Given   Pulmonary neg pulmonary ROS   Pulmonary exam normal breath sounds clear to auscultation       Cardiovascular negative cardio ROS  Rhythm:Regular Rate:Normal     Neuro/Psych   Anxiety Depression    negative neurological ROS     GI/Hepatic negative GI ROS, Neg liver ROS,,,  Endo/Other  negative endocrine ROS    Renal/GU negative Renal ROS  negative genitourinary   Musculoskeletal   Abdominal   Peds  Hematology negative hematology ROS (+)   Anesthesia Other Findings   Reproductive/Obstetrics negative OB ROS                             Anesthesia Physical Anesthesia Plan  ASA: 2  Anesthesia Plan: General   Post-op Pain Management: Tylenol PO (pre-op)* and Toradol IV (intra-op)*   Induction: Intravenous  PONV Risk Score and Plan: 4 or greater and Ondansetron, Dexamethasone and Midazolam  Airway Management Planned: LMA  Additional Equipment:   Intra-op Plan:   Post-operative Plan: Extubation in OR  Informed Consent: I have reviewed the patients History and Physical, chart, labs and discussed the procedure including the risks, benefits and alternatives for the proposed anesthesia with the patient or authorized representative who has indicated his/her understanding and acceptance.     Dental advisory given  Plan Discussed with: CRNA  Anesthesia Plan Comments:        Anesthesia Quick Evaluation

## 2022-12-14 NOTE — Op Note (Signed)
Preoperative diagnosis: Right breast radial scar upper outer quadrant  Postoperative diagnosis: Same  Procedure: Right breast seed lumpectomy  Surgeon: Harriette Bouillon, MD  Anesthesia: LMA with 0.25% Marcaine plain  EBL: Minimal  Specimen: Right breast tissue with seed and clip verified by Faxitron  Indications for procedure: The patient is a 52 year old female with a mammographic abnormality.  Core biopsy showed radial scar.  She was seen in consultation we discussed the pros and cons of excision and the potential upgrade risk of these lesions.  We discussed observation.  She was to proceed with right breast seed lumpectomy.The procedure has been discussed with the patient. Alternatives to surgery have been discussed with the patient.  Risks of surgery include bleeding,  Infection,  Seroma formation, death,  and the need for further surgery.   The patient understands and wishes to proceed.   Description of procedure: The patient was met in the holding area and questions were answered.  The right breast was marked as the correct site.  She was taken back to the operating room.  She was placed supine upon the operating room table.  After induction of general anesthesia, the right breast was prepped and draped in sterile fashion timeout performed.  Proper patient, site and procedure verified.  Films were available for review.  Neoprobe used to identify the location of the seed in the right breast upper outer quadrant.  Local anesthetic was infiltrated and a curvilinear incision was made in the right upper quadrant.  Dissection was carried down and all tissue and the seed and clip were excised with a grossly negative margin.  Irrigation was used.  Local anesthetic infiltrated.  Hemostasis achieved.  Deep tissue layers approximated with 3-0 Vicryl.  4 Monocryl used to close the skin in a subcuticular fashion.  Dermabond was applied.  Breast binder placed.  All counts found to be correct.  The patient was  awoke extubated taken to recovery in satisfactory condition.

## 2022-12-15 ENCOUNTER — Encounter (HOSPITAL_BASED_OUTPATIENT_CLINIC_OR_DEPARTMENT_OTHER): Payer: Self-pay | Admitting: Surgery

## 2022-12-16 LAB — SURGICAL PATHOLOGY

## 2022-12-18 IMAGING — US US BREAST BX W LOC DEV 1ST LESION IMG BX SPEC US GUIDE*L*
1 series · 13 of 13 positions shown · non-contrast
Comparison: Previous exam(s).
COMPARISON: Previous exam(s).

Addendum:
CLINICAL DATA: Biopsy a left breast mass at 11 o'clock, 3 cm from
the nipple

EXAM:
ULTRASOUND GUIDED LEFT BREAST CORE NEEDLE BIOPSY

[Series 1: us breast bx w loc dev 1st lesion img bx spec us g · 0.07mm/px · 13 of 13 slices shown]
[im 1/13]
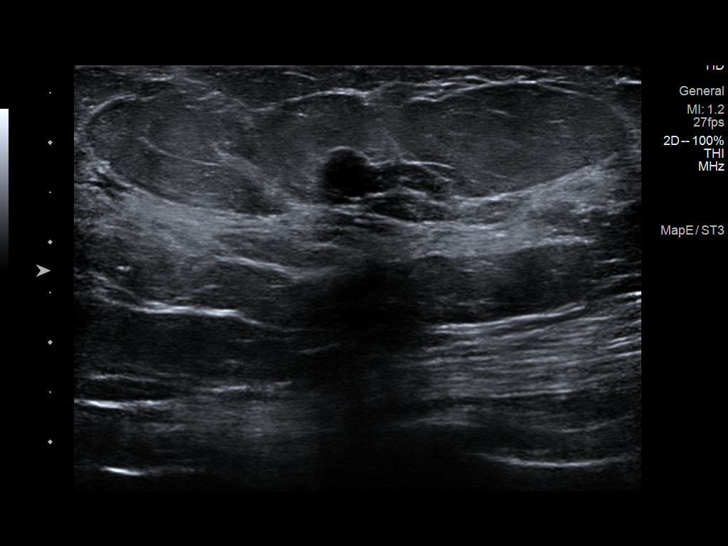
[im 2/13]
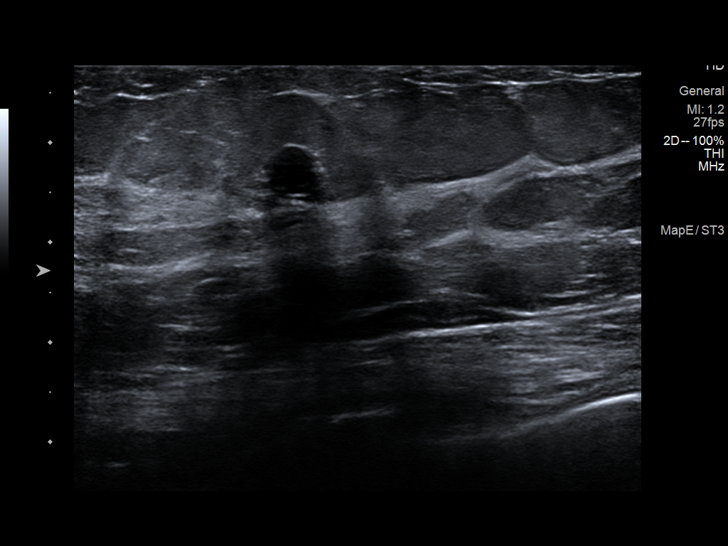
[im 3/13]
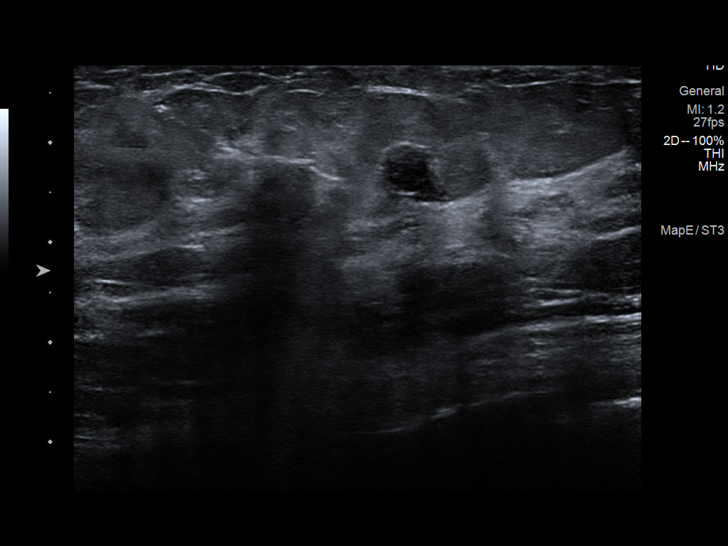
[im 4/13]
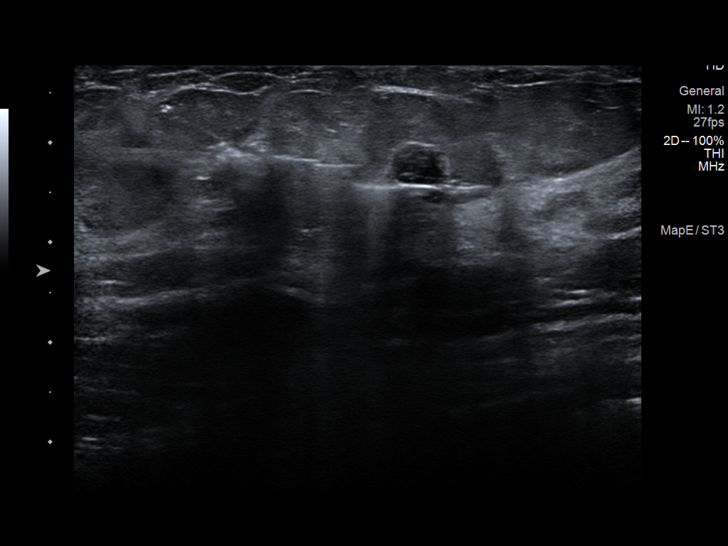
[im 5/13]
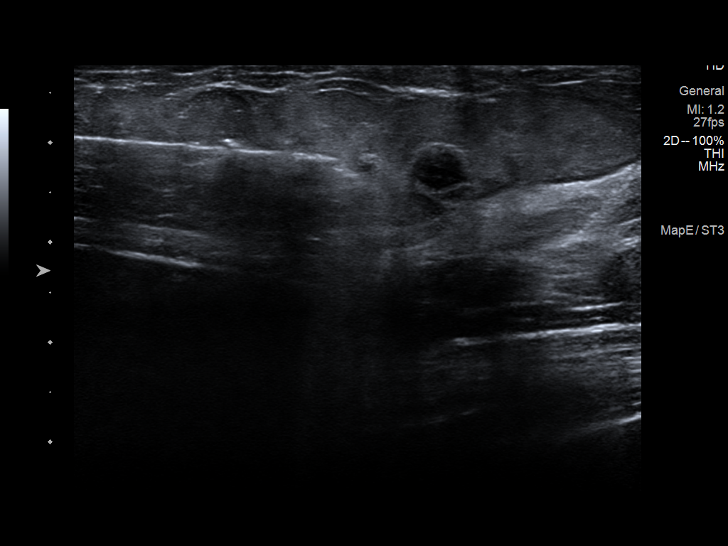
[im 6/13]
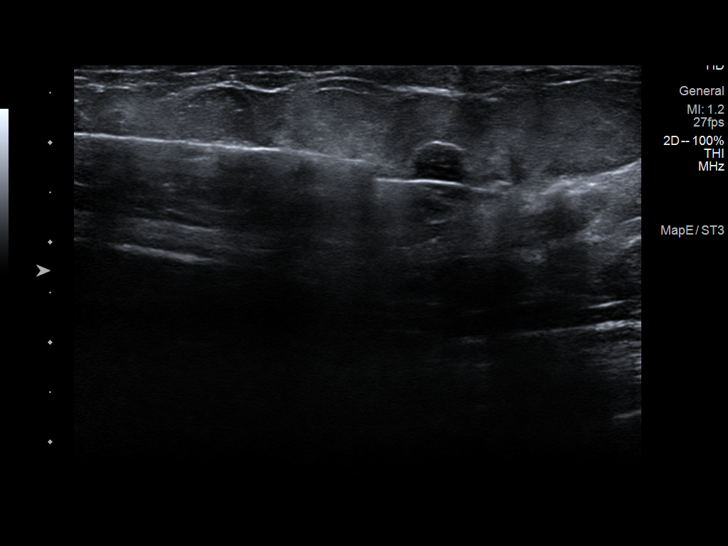
[im 7/13]
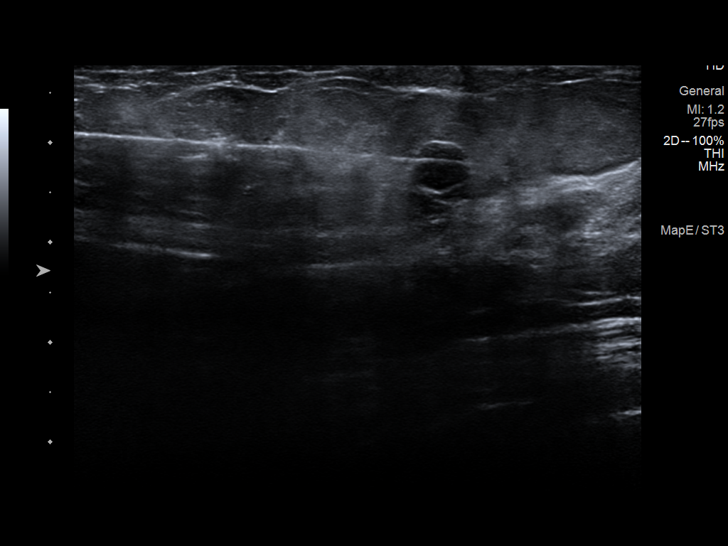
[im 8/13]
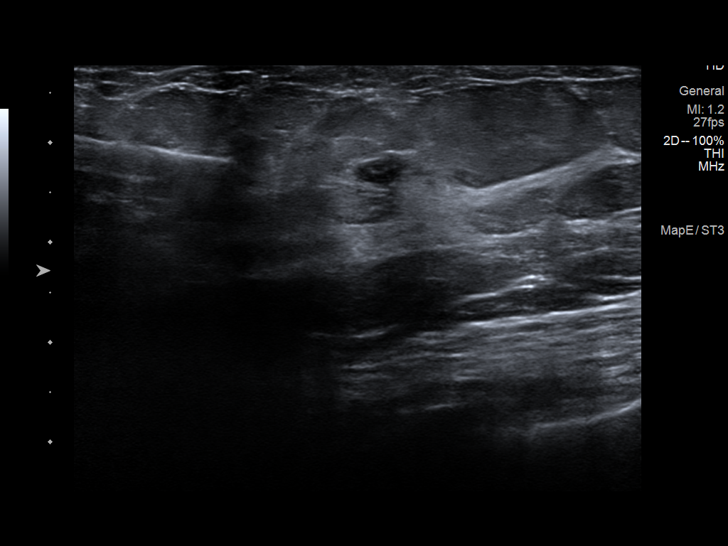
[im 9/13]
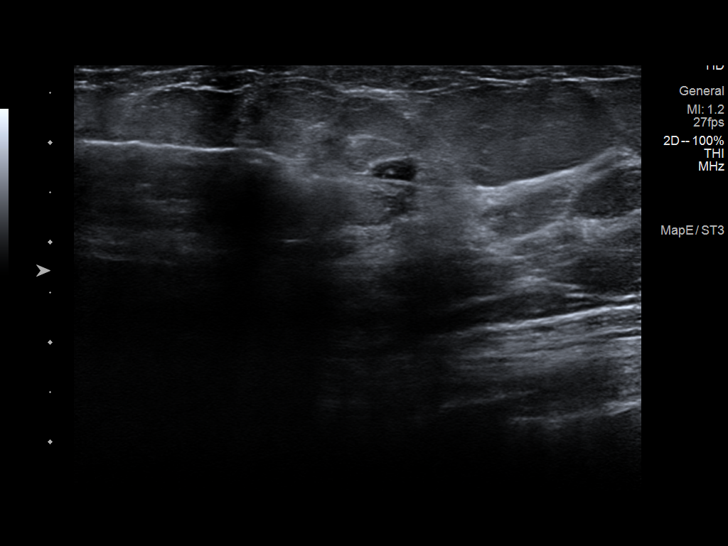
[im 10/13]
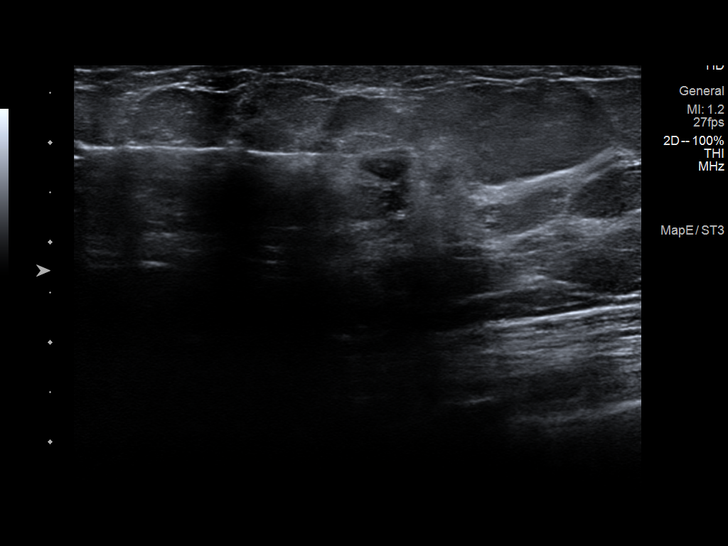
[im 11/13]
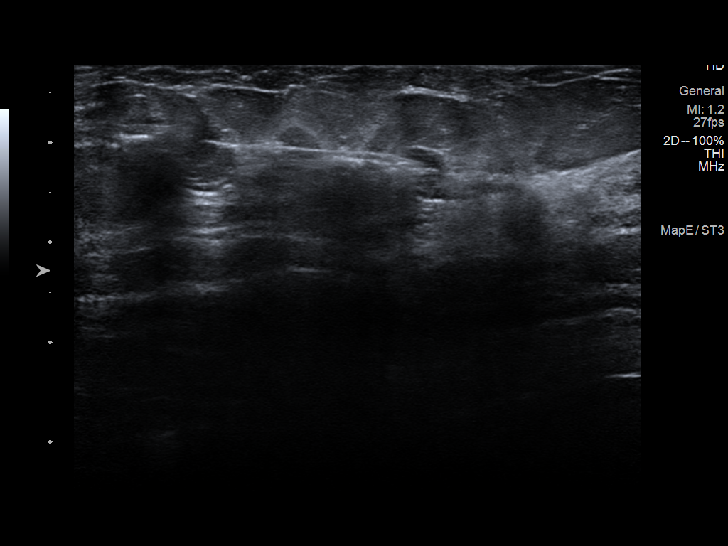
[im 12/13]
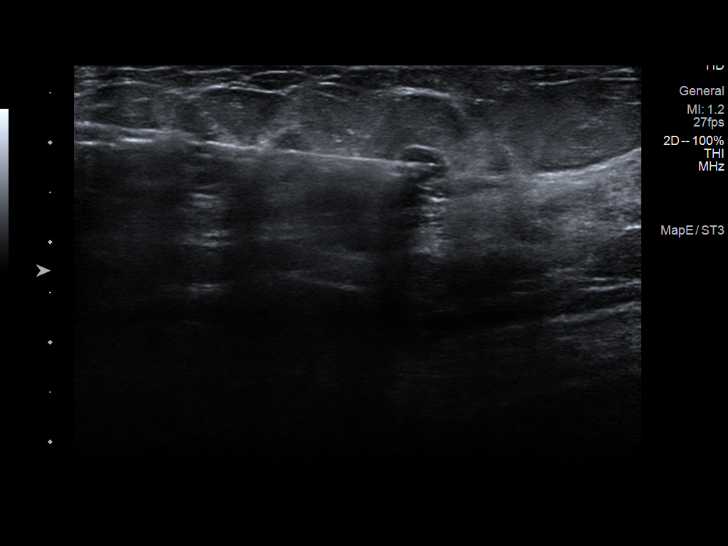
[im 13/13]
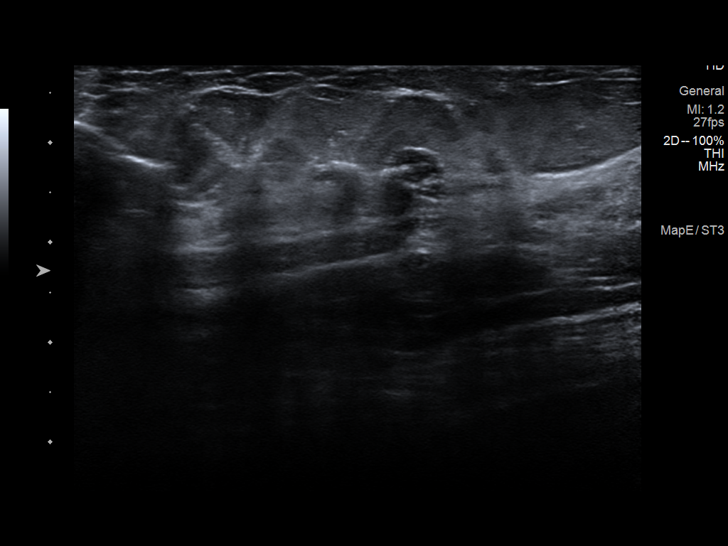

[13 of 13 positions shown; findings below may reference images not displayed]



Lesion quadrant: 11 o'clock left breast

Using sterile technique and 1% Lidocaine as local anesthetic, under
direct ultrasound visualization, a 14 gauge Ceejay device was
used to perform biopsy of and 11 o'clock left breast mass using a
lateral approach. At the conclusion of the procedure a ribbon shaped
tissue marker clip was deployed into the biopsy cavity. Follow up 2
view mammogram was performed and dictated separately.
IMPRESSION: Ultrasound guided biopsy of an 11 o'clock left breast mass. No
apparent complications.

ADDENDUM:
Pathology revealed HYALINIZED FIBROADENOMA of the LEFT breast, 11
o'clock, 3 cmfn, (ribbon clip). This was found to be concordant by
Dr. Eliyahu Reuven Obichamte.

Pathology results were discussed with the patient by telephone by
patient reported doing well after the biopsy with tenderness at the
site. Post biopsy instructions and care were reviewed and questions
were answered. The patient was encouraged to call The [REDACTED]

The patient was instructed to return for annual screening
mammography.

Pathology results reported by Golden Buckner, RN on 08/07/2021.



Lesion quadrant: 11 o'clock left breast

Using sterile technique and 1% Lidocaine as local anesthetic, under
direct ultrasound visualization, a 14 gauge Ceejay device was
used to perform biopsy of and 11 o'clock left breast mass using a
lateral approach. At the conclusion of the procedure a ribbon shaped
tissue marker clip was deployed into the biopsy cavity. Follow up 2
view mammogram was performed and dictated separately.
IMPRESSION: Ultrasound guided biopsy of an 11 o'clock left breast mass. No
apparent complications.

## 2022-12-18 IMAGING — MG MM BREAST LOCALIZATION CLIP
4 series · 4 of 12 positions shown · non-contrast
Comparison: Previous exam(s).

CLINICAL DATA: Evaluate biopsy marker

EXAM:
3D DIAGNOSTIC LEFT MAMMOGRAM POST ULTRASOUND BIOPSY

[L CC synth-2D]
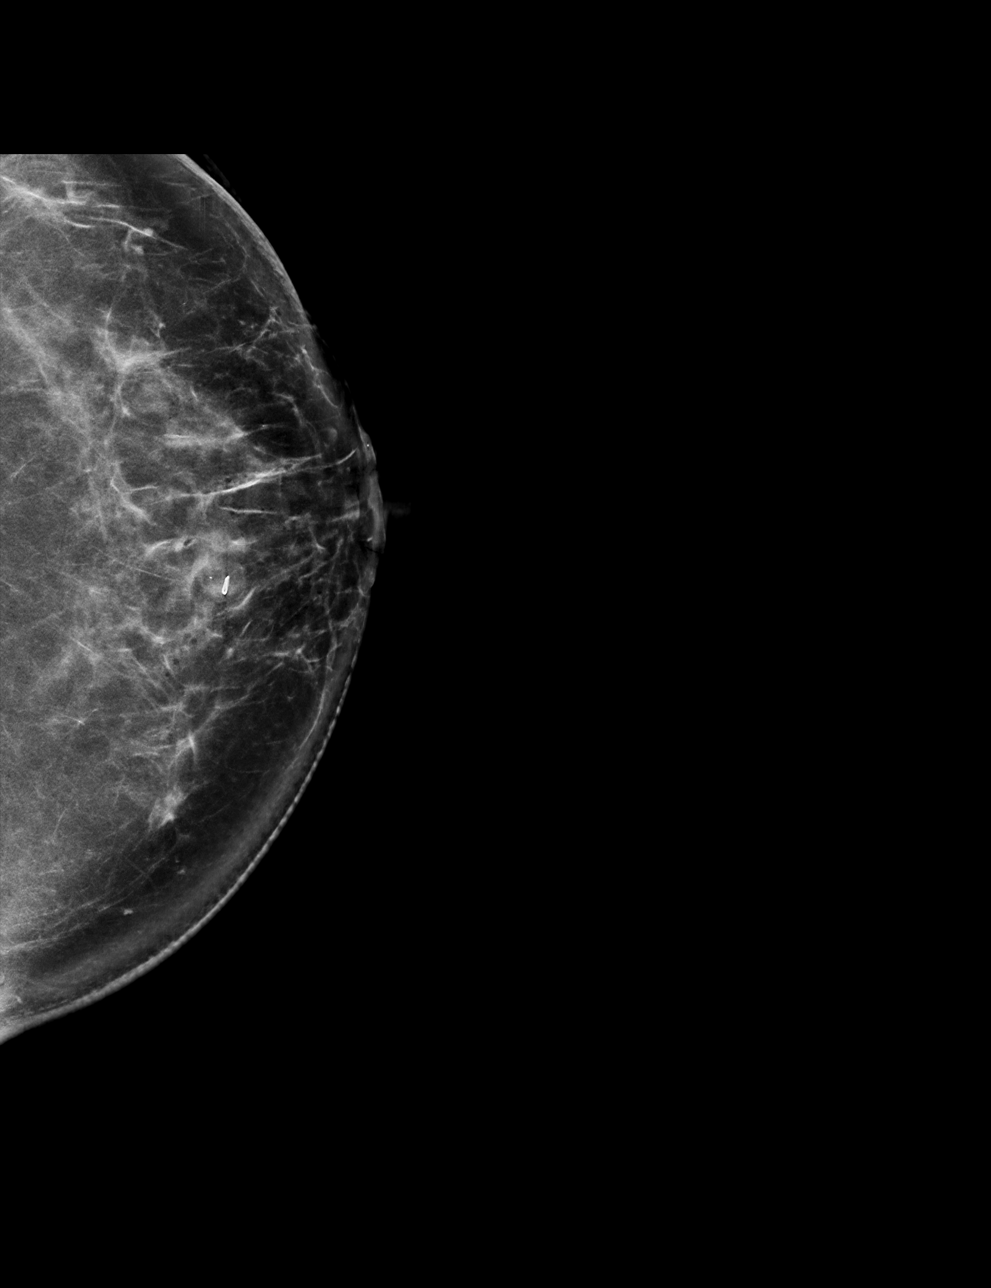

[L ML synth-2D]
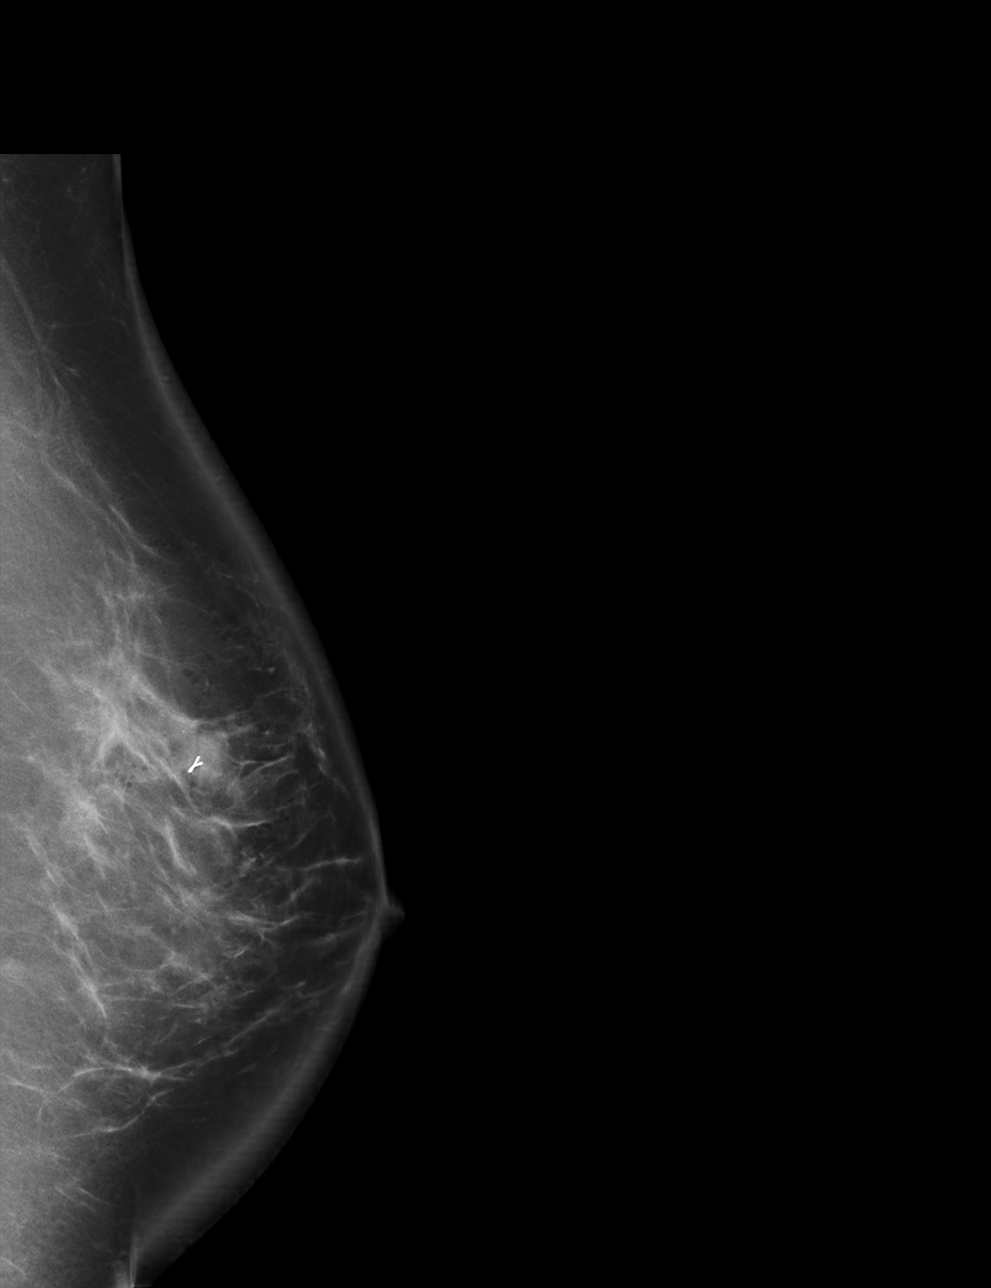

[L CC tomo · tomo slice 45/90.0]
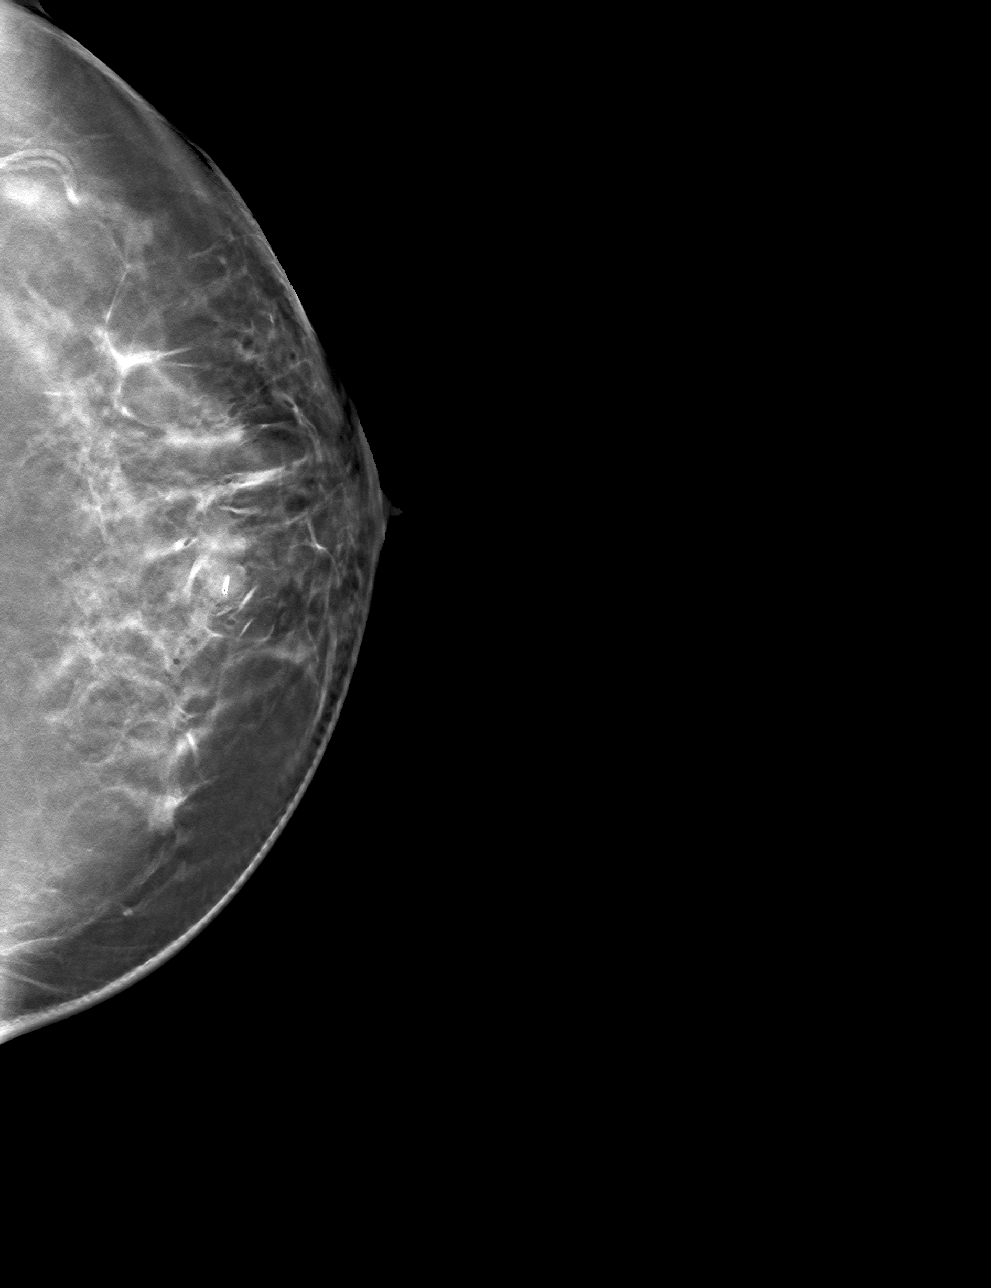

[L ML tomo · tomo slice 53/106.0]
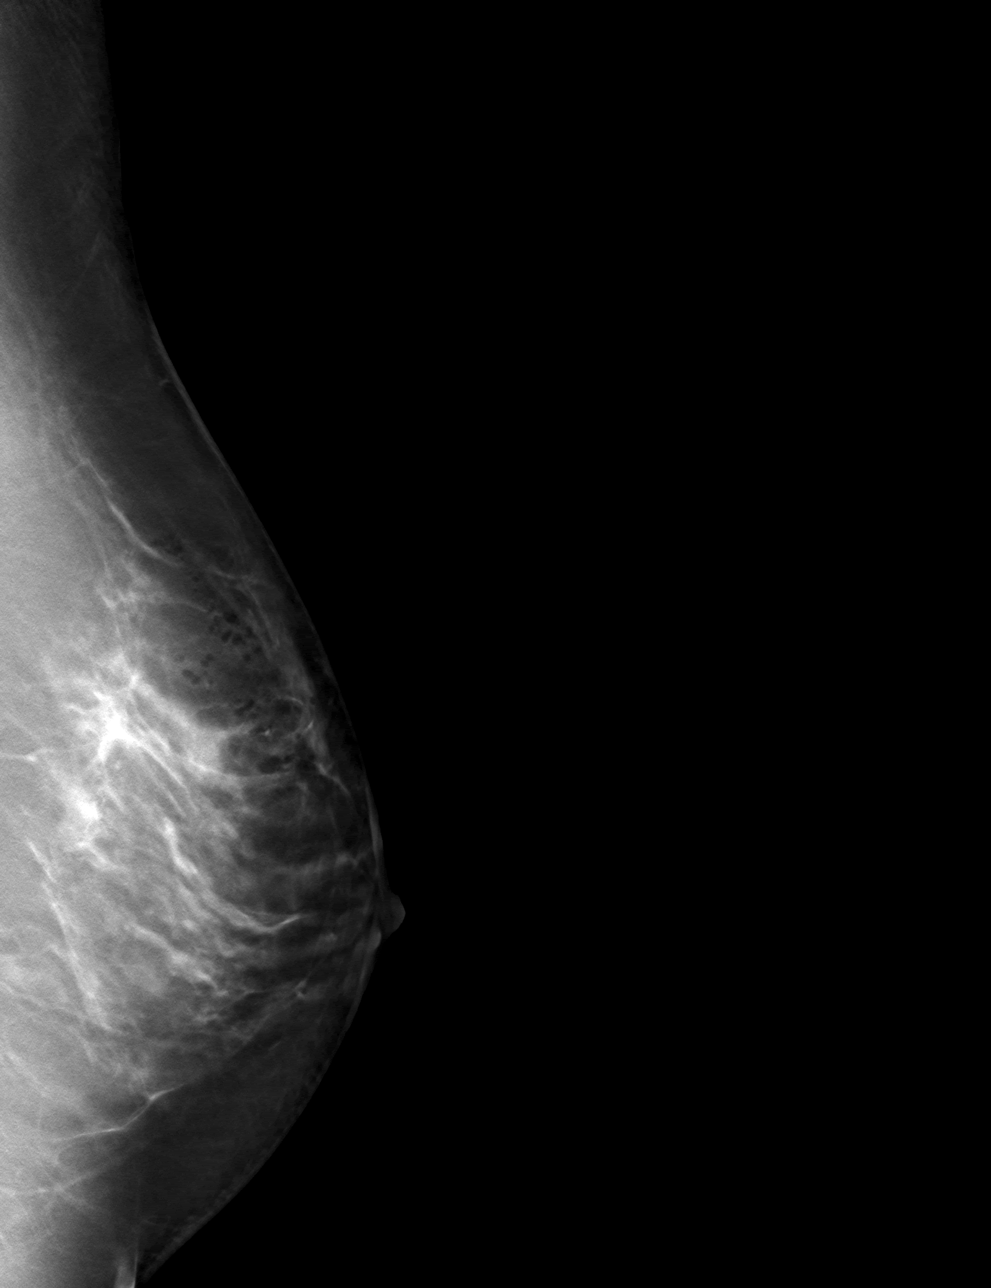

[4 of 12 positions shown; findings below may reference images not displayed]

FINDINGS: 3D Mammographic images were obtained following ultrasound guided
biopsy of an 11 o'clock left breast mass. The biopsy marking clip is
in expected position at the site of biopsy.
IMPRESSION: Appropriate positioning of the ribbon shaped biopsy marking clip at
the site of biopsy in the biopsied 11 o'clock left breast mass.

Final Assessment: Post Procedure Mammograms for Marker Placement

## 2022-12-28 ENCOUNTER — Telehealth: Payer: Self-pay

## 2022-12-28 NOTE — Telephone Encounter (Signed)
Returned patient's call using interpreterd [id number]. Patient receiving bills, informed patient to fax bills to [facility].

## 2023-01-03 NOTE — Telephone Encounter (Signed)
Returned patient's call using interpreter#[phone]. Patient called to confirm bills received in [facility] office. [facility] will call patient back and confirm.

## 2023-12-07 ENCOUNTER — Ambulatory Visit: Admission: EM | Admit: 2023-12-07 | Discharge: 2023-12-07 | Discharge status: Against Medical Advice

## 2023-12-07 NOTE — ED Triage Notes (Signed)
 Due to language barrier, an interpreter was present during the history-taking and subsequent discussion (and for part of the physical exam) with this patient. Nancy Melton. Number: [phone].  Patient declined to stay. Will be seen at another time.

## 2024-03-14 LAB — BASIC METABOLIC PANEL WITH GFR
BUN: 6 (ref 4–21)
CO2: 22 (ref 13–22)
Chloride: 102 (ref 99–108)
Creatinine: 0.4 — AB (ref 0.5–1.1)
Glucose: 103
Potassium: 3.5 meq/L (ref 3.5–5.1)
Sodium: 141 (ref 137–147)

## 2024-03-14 LAB — CBC: RBC: 4.98 (ref 3.87–5.11)

## 2024-03-14 LAB — HEPATIC FUNCTION PANEL
ALT: 16 U/L (ref 7–35)
AST: 20 (ref 13–35)
Alkaline Phosphatase: 135 — AB (ref 25–125)
Bilirubin, Total: 0.6

## 2024-03-14 LAB — CBC AND DIFFERENTIAL
HCT: 47 — AB (ref 36–46)
Hemoglobin: 15.4 (ref 12.0–16.0)
Neutrophils Absolute: 4.5
Platelets: 287 K/uL (ref 150–400)
WBC: 6

## 2024-03-14 LAB — HEMOGLOBIN A1C: Hemoglobin A1C: 5.6

## 2024-03-14 LAB — LIPID PANEL
Cholesterol: 204 — AB (ref 0–200)
HDL: 53 (ref 35–70)
LDL Cholesterol: 138
Triglycerides: 71 (ref 40–160)

## 2024-03-14 LAB — COMPREHENSIVE METABOLIC PANEL WITH GFR
Albumin: 4.7 (ref 3.5–5.0)
Calcium: 9.8 (ref 8.7–10.7)
Globulin: 2.4
eGFR: 118

## 2024-05-25 ENCOUNTER — Encounter: Payer: Self-pay | Admitting: Adult Health

## 2024-05-25 ENCOUNTER — Ambulatory Visit (INDEPENDENT_AMBULATORY_CARE_PROVIDER_SITE_OTHER): Payer: Self-pay | Admitting: Adult Health

## 2024-05-25 VITALS — BP 118/76 | HR 88 | Temp 97.9°F | Resp 18 | Ht 63.0 in | Wt 122.4 lb

## 2024-05-25 DIAGNOSIS — E785 Hyperlipidemia, unspecified: Secondary | ICD-10-CM

## 2024-05-25 DIAGNOSIS — Z113 Encounter for screening for infections with a predominantly sexual mode of transmission: Secondary | ICD-10-CM

## 2024-05-25 DIAGNOSIS — Z7689 Persons encountering health services in other specified circumstances: Secondary | ICD-10-CM

## 2024-05-25 DIAGNOSIS — I10 Essential (primary) hypertension: Secondary | ICD-10-CM

## 2024-05-25 DIAGNOSIS — Z1212 Encounter for screening for malignant neoplasm of rectum: Secondary | ICD-10-CM

## 2024-05-25 DIAGNOSIS — Z1211 Encounter for screening for malignant neoplasm of colon: Secondary | ICD-10-CM

## 2024-05-25 DIAGNOSIS — F418 Other specified anxiety disorders: Secondary | ICD-10-CM

## 2024-05-25 DIAGNOSIS — F5101 Primary insomnia: Secondary | ICD-10-CM

## 2024-05-25 DIAGNOSIS — R202 Paresthesia of skin: Secondary | ICD-10-CM

## 2024-05-25 NOTE — Progress Notes (Signed)
 [facility] clinic  Provider:  Jereld Serum DNP  Code Status:  Full Code  Goals of Care:     05/25/2024   10:03 AM  Advanced Directives  Does Patient Have a Medical Advance Directive? No  Would patient like information on creating a medical advance directive? No - Patient declined     Chief Complaint  Patient presents with   Establish Care     New patient   Discussed the use of AI scribe software for clinical note transcription with the patient, who gave verbal consent to proceed.  HPI: Patient is a 52 y.o. female seen today to establish care with [facility]. She was accompanied by a [place] interpreter.  She experiences intermittent tingling sensations in both arms, describing it as 'tingling' rather than numbness. The sensation occurs suddenly and is not constant. No numbness is reported.  She has a history of elevated cholesterol, previously recorded at 220 mg/dL. She has been prescribed lovastatin but has not been taking it due to concerns about kidney damage. She has some lovastatin at home. She works at a [employer] and sometimes consumes fried foods, though she tries to avoid them.  She has a history of depression and has been taking escitalopram (Lexapro) for eight weeks. She is currently under the care of a psychiatrist and a psychologist at [facility]. She feels 'very nervous' and experiences a 'desperate sensation inside.'  She reports not having a menstrual cycle for four to five years. She has previously undergone a Pap smear two years ago and a mammogram in 2024, which included a biopsy of the right breast where a benign mass was removed. She has not had a colonoscopy but has received Cologuard kits twice without returning them.  She has a family history of diabetes and reports that her A1c was normal two months ago.  She lives alone and has a 52 year old son. She works at a [employer] and engages in walking as exercise, approximately three times  a week for 40 minutes each session. She does not smoke or use drugs and occasionally consumes alcohol.    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   BREAST BIOPSY Right 09/08/2022   MM RT BREAST BX W LOC DEV 1ST LESION IMAGE BX SPEC STEREO GUIDE 09/08/2022 [facility]   BREAST BIOPSY  12/13/2022   MM RT RADIOACTIVE SEED LOC MAMMO GUIDE 12/13/2022 [facility]   BREAST LUMPECTOMY WITH RADIOACTIVE SEED LOCALIZATION Right 12/14/2022   Procedure: RIGHT BREAST LUMPECTOMY WITH RADIOACTIVE SEED LOCALIZATION;  Surgeon: Vanderbilt Ned, MD;  Location: [facility];  Service: General;  Laterality: Right;   EYE SURGERY      Allergies  Allergen Reactions   Other Other (See Comments)   Pollen Extract     Other Reaction(s): Other (See Comments)   Gramineae Pollens    Latex Hives    Outpatient Encounter Medications as of 05/25/2024  Medication Sig   escitalopram (LEXAPRO) 10 MG tablet Take 10 mg by mouth.   lisinopril (ZESTRIL) 10 MG tablet Take 10 mg by mouth daily.   traZODone (DESYREL) 50 MG tablet Take 25 mg by mouth at bedtime.   VITAMIN D , ERGOCALCIFEROL , PO i tab po qd   lovastatin (MEVACOR) 10 MG tablet Take 10 mg by mouth at bedtime. (Patient not taking: Reported  on 05/25/2024)   [DISCONTINUED] ibuprofen (ADVIL) 400 MG tablet ibuprofen 400 mg tablet  TAKE 1 TABLET BY MOUTH THREE TIMES DAILY FOR 10 DAYS - TAKE FOR 3 DAYS, THEN AS NEEDED (Patient not taking: Reported on 05/25/2024)   [DISCONTINUED] loratadine (CLARITIN) 10 MG tablet Take 10 mg by mouth daily. (Patient not taking: Reported on 05/25/2024)   [DISCONTINUED] meloxicam  (MOBIC ) 15 MG tablet Take 1 tablet (15 mg total) by mouth daily. (Patient not taking: Reported on 05/25/2024)   [DISCONTINUED] naproxen (NAPROSYN) 250 MG tablet Take by mouth 2 (two) times daily with a meal.  (Patient not taking: Reported on 05/25/2024)   [DISCONTINUED] norethindrone-ethinyl estradiol (FYAVOLV) 0.5-2.5 MG-MCG tablet Take 1 tablet by mouth daily. (Patient not taking: Reported on 05/25/2024)   [DISCONTINUED] oxyCODONE (OXY IR/ROXICODONE) 5 MG immediate release tablet Take 5 mg by mouth every 4 (four) hours as needed. (Patient not taking: Reported on 05/25/2024)   [DISCONTINUED] sertraline (ZOLOFT) 25 MG tablet sertraline 25 mg tablet  Take 1 tablet every day by oral route. (Patient not taking: Reported on 05/25/2024)   [DISCONTINUED] zolpidem (AMBIEN) 10 MG tablet Take by mouth. (Patient not taking: Reported on 05/25/2024)   No facility-administered encounter medications on file as of 05/25/2024.    Review of Systems:  Review of Systems  Constitutional:  Negative for appetite change, chills, fatigue and fever.  HENT:  Negative for congestion, hearing loss, rhinorrhea and sore throat.   Eyes: Negative.   Respiratory:  Negative for cough, shortness of breath and wheezing.   Cardiovascular:  Negative for chest pain, palpitations and leg swelling.  Gastrointestinal:  Negative for abdominal pain, constipation, diarrhea, nausea and vomiting.  Genitourinary:  Negative for dysuria.  Musculoskeletal:  Negative for arthralgias, back pain and myalgias.  Skin:  Negative for color change, rash and wound.  Neurological:  Negative for dizziness, weakness and headaches.  Psychiatric/Behavioral:  Negative for behavioral problems. The patient is not nervous/anxious.     Health Maintenance  Topic Date Due   DTaP/Tdap/Td (1 - Tdap) Never done   Hepatitis B Vaccines 19-59 Average Risk (1 of 3 - 19+ 3-dose series) Never done   Colonoscopy  Never done   Pneumococcal Vaccine: 50+ Years (1 of 1 - PCV) Never done   Zoster Vaccines- Shingrix (1 of 2) Never done   Influenza Vaccine  Never done   COVID-19 Vaccine (3 - 2025-26 season) 05/07/2024   Mammogram  08/03/2024   Cervical Cancer Screening (HPV/Pap  Cotest)  07/28/2026   Hepatitis C Screening  Completed   HIV Screening  Completed   HPV VACCINES  Aged Out   Meningococcal B Vaccine  Aged Out    Physical Exam: Vitals:   05/25/24 1010  BP: 118/76  Pulse: 88  Resp: 18  Temp: 97.9 F (36.6 C)  SpO2: 98%  Weight: 122 lb 6.4 oz (55.5 kg)  Height: 5' 3 (1.6 m)   Body mass index is 21.68 kg/m. Physical Exam Constitutional:      Appearance: Normal appearance.  HENT:     Head: Normocephalic and atraumatic.     Nose: Nose normal.     Mouth/Throat:     Mouth: Mucous membranes are moist.  Eyes:     Conjunctiva/sclera: Conjunctivae normal.  Cardiovascular:     Rate and Rhythm: Normal rate and regular rhythm.  Pulmonary:     Effort: Pulmonary effort is normal.     Breath sounds: Normal breath sounds.  Abdominal:     General: Bowel sounds are  normal.     Palpations: Abdomen is soft.  Musculoskeletal:        General: Normal range of motion.     Cervical back: Normal range of motion.  Skin:    General: Skin is warm and dry.  Neurological:     General: No focal deficit present.     Mental Status: She is alert and oriented to person, place, and time.  Psychiatric:        Mood and Affect: Mood normal.        Behavior: Behavior normal.        Thought Content: Thought content normal.        Judgment: Judgment normal.     Labs reviewed: Basic Metabolic Panel: Recent Labs    05/25/24 1108  NA 139  K 3.8  CL 102  CO2 26  GLUCOSE 105*  BUN 9  CREATININE 0.41*  CALCIUM 9.9  TSH 0.95   Liver Function Tests: Recent Labs    05/25/24 1108  AST 22  ALT 19  BILITOT 0.6  PROT 6.9   No results for input(s): LIPASE, AMYLASE in the last 8760 hours. No results for input(s): AMMONIA in the last 8760 hours. CBC: Recent Labs    05/25/24 1108  WBC 7.4  NEUTROABS 5,757  HGB 15.5  HCT 45.3*  MCV 93.4  PLT 282   Lipid Panel: Recent Labs    05/25/24 1108  CHOL 201*  HDL 61  LDLCALC 123*  TRIG 78  CHOLHDL  3.3   No results found for: HGBA1C  Procedures since last visit: No results found.  Assessment/Plan  1. Primary hypertension (Primary) -  BP 118/76, stable Continue lisinopril 10 mg daily-    - Comprehensive metabolic panel - CBC with Differential/Platelets - TSH  2. Mixed anxiety and depressive disorder -  Severe depression persists despite 8 weeks on escitalopram. Under psychiatric and psychological care at [facility]. - Continue psychiatric care with Dr. Claudene at [facility]. - Encourage continuation of physical exercise for mental health benefits. - TSH  3. Dyslipidemia -  Cholesterol level previously at 220 mg/dL. Concerns about lovastatin and kidney damage clarified as unfounded. - Resume lovastatin once daily with food. - Order fasting lipid panel to assess current cholesterol levels. - Advise increased vegetable intake and reduced fried food consumption. - TSH - Lipid panel  4. Paresthesias -  Intermittent tingling in both arms without numbness. Previous tests showed high B12 levels. Differential includes vitamin B12 deficiency or thyroid dysfunction. - Order TSH and vitamin B12 levels to assess thyroid function and vitamin B12 status.  5. Primary insomnia -Continue trazodone 50 mg at bedtime - TSH - Vitamin D , 25-hydroxy  6. Screen for STD (sexually transmitted disease) - HIV antibody (with reflex) - Hep C Antibody  7. Screening for colorectal cancer - Fecal Globin By Immunochemistry  8. Encounter to establish care -  established care with [facility]     Labs/tests ordered: CBC, CMP, FOBT, hep C and HIV antibodies, lipid, TSH, vitamin D  level   Return in about 2 weeks (around 06/08/2024).  Ellon Marasco Medina-Vargas, NP

## 2024-05-27 ENCOUNTER — Ambulatory Visit: Payer: Self-pay | Admitting: Adult Health

## 2024-05-27 ENCOUNTER — Encounter: Payer: Self-pay | Admitting: Adult Health

## 2024-05-27 NOTE — Progress Notes (Signed)
-     Electrolytes, liver enzymes, TSH, vitamin D  level, all normal - No anemia  - LDL elevated, increase vegetable intake, continue to exercise for at least 150 minutes/week, low-fat diet - Hep C and HIV antibodies negative

## 2024-05-28 ENCOUNTER — Other Ambulatory Visit: Payer: Self-pay | Admitting: Adult Health

## 2024-05-28 DIAGNOSIS — R202 Paresthesia of skin: Secondary | ICD-10-CM

## 2024-05-28 LAB — HEPATITIS C ANTIBODY: Hepatitis C Ab: NONREACTIVE

## 2024-05-28 LAB — CBC WITH DIFFERENTIAL/PLATELET
Absolute Lymphocytes: 1325 {cells}/uL (ref 850–3900)
Absolute Monocytes: 259 {cells}/uL (ref 200–950)
Basophils Absolute: 37 {cells}/uL (ref 0–200)
Basophils Relative: 0.5 %
Eosinophils Absolute: 22 {cells}/uL (ref 15–500)
Eosinophils Relative: 0.3 %
HCT: 45.3 % — ABNORMAL HIGH (ref 35.0–45.0)
Hemoglobin: 15.5 g/dL (ref 11.7–15.5)
MCH: 32 pg (ref 27.0–33.0)
MCHC: 34.2 g/dL (ref 32.0–36.0)
MCV: 93.4 fL (ref 80.0–100.0)
MPV: 10.4 fL (ref 7.5–12.5)
Monocytes Relative: 3.5 %
Neutro Abs: 5757 {cells}/uL (ref 1500–7800)
Neutrophils Relative %: 77.8 %
Platelets: 282 Thousand/uL (ref 140–400)
RBC: 4.85 Million/uL (ref 3.80–5.10)
RDW: 11.8 % (ref 11.0–15.0)
Total Lymphocyte: 17.9 %
WBC: 7.4 Thousand/uL (ref 3.8–10.8)

## 2024-05-28 LAB — LIPID PANEL
Cholesterol: 201 mg/dL — ABNORMAL HIGH (ref <200)
HDL: 61 mg/dL (ref >=50)
LDL Cholesterol (Calc): 123 mg/dL — ABNORMAL HIGH
Non-HDL Cholesterol (Calc): 140 mg/dL — ABNORMAL HIGH (ref <130)
Total CHOL/HDL Ratio: 3.3 (calc) (ref <5.0)
Triglycerides: 78 mg/dL (ref <150)

## 2024-05-28 LAB — COMPREHENSIVE METABOLIC PANEL WITH GFR
AG Ratio: 2 (calc) (ref 1.0–2.5)
ALT: 19 U/L (ref 6–29)
AST: 22 U/L (ref 10–35)
Albumin: 4.6 g/dL (ref 3.6–5.1)
Alkaline phosphatase (APISO): 116 U/L (ref 37–153)
BUN/Creatinine Ratio: 22 (calc) (ref 6–22)
BUN: 9 mg/dL (ref 7–25)
CO2: 26 mmol/L (ref 20–32)
Calcium: 9.9 mg/dL (ref 8.6–10.4)
Chloride: 102 mmol/L (ref 98–110)
Creat: 0.41 mg/dL — ABNORMAL LOW (ref 0.50–1.03)
Globulin: 2.3 g/dL (ref 1.9–3.7)
Glucose, Bld: 105 mg/dL — ABNORMAL HIGH (ref 65–99)
Potassium: 3.8 mmol/L (ref 3.5–5.3)
Sodium: 139 mmol/L (ref 135–146)
Total Bilirubin: 0.6 mg/dL (ref 0.2–1.2)
Total Protein: 6.9 g/dL (ref 6.1–8.1)
eGFR: 118 mL/min/1.73m2 (ref >=60)

## 2024-05-28 LAB — TEST AUTHORIZATION

## 2024-05-28 LAB — VITAMIN B12: Vitamin B-12: 706 pg/mL (ref 200–1100)

## 2024-05-28 LAB — HIV ANTIBODY (ROUTINE TESTING W REFLEX)
HIV 1&2 Ab, 4th Generation: NONREACTIVE
HIV FINAL INTERPRETATION: NEGATIVE

## 2024-05-28 LAB — VITAMIN D 25 HYDROXY (VIT D DEFICIENCY, FRACTURES): Vit D, 25-Hydroxy: 67 ng/mL (ref 30–100)

## 2024-05-28 LAB — TSH: TSH: 0.95 m[IU]/L

## 2024-06-04 ENCOUNTER — Telehealth: Payer: Self-pay | Admitting: Adult Health

## 2024-06-04 NOTE — Telephone Encounter (Unsigned)
 Copied from CRM [id number]. Topic: Clinical - Medical Advice >> Jun 04, 2024  5:10 PM Mercer PEDLAR wrote: Reason for CRM: Patient has questions regarding the stool collection kit that she has at home. She is requesting a callback. Interpreter: [id number]- [place]

## 2024-06-05 NOTE — Telephone Encounter (Signed)
 Outgoing call placed via interrupter services ([phone]), spoke with patient, patient was questioning if she should collect specimen in the morning or evening. Patient was advised that it doesn't matter, as long as it is returned within 24 hours of collection

## 2024-06-08 ENCOUNTER — Ambulatory Visit (INDEPENDENT_AMBULATORY_CARE_PROVIDER_SITE_OTHER): Payer: Self-pay | Admitting: Adult Health

## 2024-06-08 ENCOUNTER — Encounter: Payer: Self-pay | Admitting: Adult Health

## 2024-06-08 VITALS — BP 126/68 | HR 92 | Temp 97.7°F | Resp 18 | Ht 63.0 in | Wt 123.0 lb

## 2024-06-08 DIAGNOSIS — F5101 Primary insomnia: Secondary | ICD-10-CM

## 2024-06-08 DIAGNOSIS — R202 Paresthesia of skin: Secondary | ICD-10-CM

## 2024-06-08 DIAGNOSIS — R1084 Generalized abdominal pain: Secondary | ICD-10-CM

## 2024-06-08 DIAGNOSIS — F418 Other specified anxiety disorders: Secondary | ICD-10-CM

## 2024-06-08 DIAGNOSIS — E782 Mixed hyperlipidemia: Secondary | ICD-10-CM

## 2024-06-08 DIAGNOSIS — I10 Essential (primary) hypertension: Secondary | ICD-10-CM

## 2024-06-08 DIAGNOSIS — Z23 Encounter for immunization: Secondary | ICD-10-CM

## 2024-06-08 DIAGNOSIS — W5503XA Scratched by cat, initial encounter: Secondary | ICD-10-CM

## 2024-06-08 MED ORDER — TETANUS-DIPHTHERIA TOXOIDS TD 5-2 LFU IM INJ
0.5000 mL | INJECTION | Freq: Once | INTRAMUSCULAR | Status: DC
Start: 2024-06-08 — End: 2024-06-08

## 2024-06-08 MED ORDER — SULFAMETHOXAZOLE-TRIMETHOPRIM 800-160 MG PO TABS
1.0000 | ORAL_TABLET | Freq: Two times a day (BID) | ORAL | 0 refills | Status: AC
Start: 2024-06-08 — End: 2024-06-15

## 2024-06-08 MED ORDER — TRAZODONE HCL 50 MG PO TABS: 50.0000 mg | ORAL_TABLET | Freq: Every day | ORAL | Status: DC

## 2024-06-08 NOTE — Progress Notes (Signed)
 [facility] clinic  Provider:  Jereld Serum DNP  Code Status: Full Code  Goals of Care:     05/25/2024   10:03 AM  Advanced Directives  Does Patient Have a Medical Advance Directive? No  Would patient like information on creating a medical advance directive? No - Patient declined     Chief Complaint  Patient presents with   Medical Management of Chronic Issues     2 weeks, Patient refuses the flu and pneumonia but want to get it a later day.    Discussed the use of AI scribe software for clinical note transcription with the patient, who gave verbal consent to proceed.  HPI: Patient is a 52 y.o. female seen today for a 2-week follow up of chronic medical issues.  She has been experiencing tingling and a sensation of warmth in her arms and cheeks, describing it as feeling 'very warm from the inside.' These symptoms are still present. No fever is present despite feeling warm, and there is no numbness, but tingling is noted.  She occasionally experiences central abdominal pain and sometimes feels as if she has a fever, although she does not have an actual temperature. She is not currently taking Protonix for acid reflux.  She has hypertension and is taking lisinopril 10 mg daily. She is taking Mevacor (lovastatin) 10 mg at bedtime. She tries to eat a healthy diet with vegetables but does not exercise regularly.  She reports depression, which is not well-controlled. Her medication, Lexapro, was recently increased to 20 mg, and she takes it at night. She also takes trazodone 50 mg at bedtime for sleep, which was increased a few months ago. She sleeps 5-6 hours per night, often waking up early and unable to return to sleep.  She was scratched by a wild cat on her left middle finger and is concerned about the need for a tetanus vaccine. Her son developed a bump under his arm after a similar incident.    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   BREAST BIOPSY Right 09/08/2022   MM RT BREAST BX W LOC DEV 1ST LESION IMAGE BX SPEC STEREO GUIDE 09/08/2022 [facility]   BREAST BIOPSY  12/13/2022   MM RT RADIOACTIVE SEED LOC MAMMO GUIDE 12/13/2022 [facility]   BREAST LUMPECTOMY WITH RADIOACTIVE SEED LOCALIZATION Right 12/14/2022   Procedure: RIGHT BREAST LUMPECTOMY WITH RADIOACTIVE SEED LOCALIZATION;  Surgeon: Vanderbilt Ned, MD;  Location: [facility];  Service: General;  Laterality: Right;   EYE SURGERY      Allergies  Allergen Reactions   Other Other (See Comments)   Pollen Extract     Other Reaction(s): Other (See Comments)   Gramineae Pollens    Latex Hives    Outpatient Encounter Medications as of 06/08/2024  Medication Sig   escitalopram (LEXAPRO) 20 MG tablet Take 20 mg by mouth.   lisinopril (ZESTRIL) 10 MG tablet Take 10 mg by mouth daily.   sulfamethoxazole-trimethoprim (BACTRIM DS) 800-160 MG tablet Take 1 tablet by mouth 2 (two) times daily for 7 days.   VITAMIN D , ERGOCALCIFEROL , PO i tab po qd   [DISCONTINUED] escitalopram (LEXAPRO) 10 MG tablet Take 10 mg by mouth.   [DISCONTINUED] traZODone (DESYREL) 50 MG tablet Take 25 mg by mouth at bedtime.   lovastatin (MEVACOR) 10 MG tablet Take  10 mg by mouth at bedtime. (Patient not taking: Reported on 06/08/2024)   traZODone (DESYREL) 50 MG tablet Take 1 tablet (50 mg total) by mouth at bedtime.   [DISCONTINUED] tetanus & diphtheria toxoids (adult) (TENIVAC) injection 0.5 mL    No facility-administered encounter medications on file as of 06/08/2024.    Review of Systems:  Review of Systems  Constitutional:  Negative for appetite change, chills, fatigue and fever.  HENT:  Negative for congestion, hearing loss, rhinorrhea and sore throat.   Eyes: Negative.   Respiratory:  Negative for cough, shortness of breath and wheezing.   Cardiovascular:   Negative for chest pain, palpitations and leg swelling.  Gastrointestinal:  Negative for abdominal pain, constipation, diarrhea, nausea and vomiting.  Genitourinary:  Negative for dysuria.  Musculoskeletal:  Negative for arthralgias, back pain and myalgias.  Skin:  Negative for color change, rash and wound.  Neurological:  Negative for dizziness, weakness and headaches.  Psychiatric/Behavioral:  Negative for behavioral problems. The patient is not nervous/anxious.     Health Maintenance  Topic Date Due   DTaP/Tdap/Td (1 - Tdap) Never done   Hepatitis B Vaccines 19-59 Average Risk (1 of 3 - 19+ 3-dose series) Never done   Colonoscopy  Never done   Pneumococcal Vaccine: 50+ Years (1 of 1 - PCV) Never done   Zoster Vaccines- Shingrix (1 of 2) Never done   Influenza Vaccine  Never done   COVID-19 Vaccine (3 - 2025-26 season) 05/07/2024   Mammogram  08/03/2024   Cervical Cancer Screening (HPV/Pap Cotest)  07/28/2026   Hepatitis C Screening  Completed   HIV Screening  Completed   HPV VACCINES  Aged Out   Meningococcal B Vaccine  Aged Out    Physical Exam: Vitals:   06/08/24 0924  BP: 126/68  Pulse: 92  Resp: 18  Temp: 97.7 F (36.5 C)  SpO2: 98%  Weight: 123 lb (55.8 kg)  Height: 5' 3 (1.6 m)   Body mass index is 21.79 kg/m. Physical Exam Constitutional:      Appearance: Normal appearance.  HENT:     Head: Normocephalic and atraumatic.     Nose: Nose normal.     Mouth/Throat:     Mouth: Mucous membranes are moist.  Eyes:     Conjunctiva/sclera: Conjunctivae normal.  Cardiovascular:     Rate and Rhythm: Normal rate and regular rhythm.  Pulmonary:     Effort: Pulmonary effort is normal.     Breath sounds: Normal breath sounds.  Abdominal:     General: Bowel sounds are normal.     Palpations: Abdomen is soft.  Musculoskeletal:        General: Normal range of motion.     Cervical back: Normal range of motion.  Skin:    General: Skin is warm and dry.     Comments:  0.5 cm scratch on left middle finger.  Neurological:     General: No focal deficit present.     Mental Status: She is alert and oriented to person, place, and time.  Psychiatric:        Mood and Affect: Mood normal.        Behavior: Behavior normal.        Thought Content: Thought content normal.        Judgment: Judgment normal.     Labs reviewed: Basic Metabolic Panel: Recent Labs    05/25/24 1108  NA 139  K 3.8  CL 102  CO2 26  GLUCOSE 105*  BUN 9  CREATININE 0.41*  CALCIUM 9.9  TSH 0.95   Liver Function Tests: Recent Labs    05/25/24 1108  AST 22  ALT 19  BILITOT 0.6  PROT 6.9   No results for input(s): LIPASE, AMYLASE in the last 8760 hours. No results for input(s): AMMONIA in the last 8760 hours. CBC: Recent Labs    05/25/24 1108  WBC 7.4  NEUTROABS 5,757  HGB 15.5  HCT 45.3*  MCV 93.4  PLT 282   Lipid Panel: Recent Labs    05/25/24 1108  CHOL 201*  HDL 61  LDLCALC 123*  TRIG 78  CHOLHDL 3.3   No results found for: HGBA1C  Procedures since last visit: No results found.  Assessment/Plan  1. Paresthesias (Primary) -  Persistent tingling in arms and cheeks. Normal Vitamin B and TSH levels. - Refer to neurologist for further evaluation. - Ambulatory referral to Neurology  2. Cat scratch -  Scratch from wild cat. Prophylactic treatment due to infection risk. Tetanus vaccine due. - Administer tetanus vaccine. - Prescribe Bactrim for 5 days. - sulfamethoxazole-trimethoprim (BACTRIM DS) 800-160 MG tablet; Take 1 tablet by mouth 2 (two) times daily for 7 days.  Dispense: 14 tablet; Refill: 0 - Tdap vaccine greater than or equal to 7yo IM  3. Mixed anxiety and depressive disorder -  Depression not well-controlled. Lexapro increased to 20 mg. Advised morning intake to avoid sleep interference. - Continue Lexapro 20 mg in the morning. - escitalopram (LEXAPRO) 20 MG tablet; Take 20 mg by mouth.  4. Primary insomnia -  Continue  trazodone 50 mg at bedtime. - traZODone (DESYREL) 50 MG tablet; Take 1 tablet (50 mg total) by mouth at bedtime.  5. Primary hypertension -  Blood pressure controlled at 126/68 mmHg on lisinopril 10 mg daily. - Continue lisinopril 10 mg daily.  6. Mixed hyperlipidemia -  Managed with lovastatin 10 mg at bedtime. Advised on diet and physical activity. - Continue lovastatin 10 mg at bedtime. - Encourage healthy diet and exercise for at least 150 minutes per week.   7. Immunization due -  Tdap vaccine greater than or equal to 7yo IM  8. Generalized abdominal pain - H. pylori breath test     Labs/tests ordered:   H. Pylori breath test   Return in about 3 months (around 09/08/2024).  Nancy Blackwell Medina-Vargas, NP

## 2024-06-11 ENCOUNTER — Ambulatory Visit: Payer: Self-pay | Admitting: Adult Health

## 2024-06-11 LAB — H. PYLORI BREATH TEST: H. pylori Breath Test: NOT DETECTED

## 2024-06-11 NOTE — Progress Notes (Signed)
-

## 2024-06-13 LAB — FECAL GLOBIN BY IMMUNOCHEMISTRY
FECAL GLOBIN RESULT:: NOT DETECTED
MICRO NUMBER:: 17053732
SPECIMEN QUALITY:: ADEQUATE

## 2024-06-13 NOTE — Progress Notes (Signed)
 Fecal globin negative, no GI bleed, normal result

## 2024-06-18 ENCOUNTER — Encounter: Payer: Self-pay | Admitting: Neurology

## 2024-06-28 NOTE — Patient Instructions (Incomplete)
 Gracias, Sra. Carrera Jarquin, por permitirnos atenderle [employer]. Hoy hablamos de su ansiedad y depresin y su hormigueo en los brazos. Es ignacia buena idea llamar a su psiquiatra para hacer un seguimiento y hacerle saber que tambin est comenzando a tomar algunos medicamentos nuevos para la ansiedad.  Nuevos medicamentos: - Hidroxizina 10mg  (Hydroxyzine) segn sea necesario por la noche para ayudarle a dormir. Esta es la medicina para dormir.  - Propanolol 10mg . Este es el medicamento que tomar segn sea necesario durante el da para eventos que puedan causarle ataques de pnico y palpitaciones.  - Buspirona (Buspirone/Buspar) 7.5mg . Sydnee es el medicamento que tomar dos veces al da, una por la maana con el desayuno y la otra por la noche con su escitalopram. Es seguro tomarlo en el momento seguro con el escitalopram.   Por favor, haga seguimiento en: Un mes para darle seguimiento a su ansiedad    Esperamos verlo pronto. Si tiene alguna pregunta o inquietud, llame a nuestra clnica al [phone]. El mejor horario para llamar es de lunes a viernes de 9:00 a.m. a 4:00 p.m., pero hay alguien [employer], los 7 [address]  [address] de la [place]. Si llama fuera del horario de atencin o durante el fin de [place], llame al nmero principal del hospital y pregunte por el Residente de [facility] [place]. Si necesita resurtidos de [employer], notifique a su farmacia con una semana de anticipacin y nos enviarn una solicitud.  Gracias por permitirnos participar en su atencin. Le deseamos lo mejor!  Kenith Trickel, DO 24/06/2024 Programa de Residencia de [facility]

## 2024-06-28 NOTE — Progress Notes (Unsigned)
 New Patient Office Visit  Subjective    Patient ID: Nancy Melton, female    DOB: [birth year]  Age: 53 y.o. MRN: [id number]  CC: No chief complaint on file.   HPI Nancy Melton presents to establish care today. She is a 53 year old female with a past medical history of hypertension, dyslipidemia, anxiety, and depression who presents today to establish care with the [facility] clinic and was previously seen by the [facility]. Please see problem-based assessment and plan below for details. ***  Outpatient Encounter Medications as of 06/29/2024  Medication Sig   escitalopram (LEXAPRO) 20 MG tablet Take 20 mg by mouth.   lisinopril (ZESTRIL) 10 MG tablet Take 10 mg by mouth daily.   lovastatin (MEVACOR) 10 MG tablet Take 10 mg by mouth at bedtime. (Patient not taking: Reported on 06/08/2024)   traZODone (DESYREL) 50 MG tablet Take 1 tablet (50 mg total) by mouth at bedtime.   VITAMIN D , ERGOCALCIFEROL , PO i tab po qd   No facility-administered encounter medications on file as of 06/29/2024.    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   BREAST BIOPSY Right 09/08/2022   MM RT BREAST BX W LOC DEV 1ST LESION IMAGE BX SPEC STEREO GUIDE 09/08/2022 [facility]   BREAST BIOPSY  12/13/2022   MM RT RADIOACTIVE SEED LOC MAMMO GUIDE 12/13/2022 [facility]   BREAST LUMPECTOMY WITH RADIOACTIVE SEED LOCALIZATION Right 12/14/2022   Procedure: RIGHT BREAST LUMPECTOMY WITH RADIOACTIVE SEED LOCALIZATION;  Surgeon: Vanderbilt Ned, MD;  Location: [facility];  Service: General;  Laterality: Right;   EYE SURGERY      Family History  Problem Relation Age of Onset   Diabetes Brother    Breast cancer Neg Hx     Social History   Socioeconomic History   Marital status: Single    Spouse name: Not on file    Number of children: 1   Years of education: Not on file   Highest education level: Professional school degree (e.g., MD, DDS, DVM, JD)  Occupational History   Not on file  Tobacco Use   Smoking status: Never   Smokeless tobacco: Never  Vaping Use   Vaping status: Never Used  Substance and Sexual Activity   Alcohol use: Not Currently   Drug use: Never   Sexual activity: Not Currently    Birth control/protection: Post-menopausal  Other Topics Concern   Not on file  Social History Narrative   Not on file   Social Drivers of Health   Financial Resource Strain: Not on file  Food Insecurity: No Food Insecurity (08/03/2022)   Hunger Vital Sign    Worried About Running Out of Food in the Last Year: Never true    Ran Out of Food in the Last Year: Never true  Transportation Needs: No Transportation Needs (08/03/2022)   PRAPARE - Administrator, Civil Service (Medical): No    Lack of Transportation (Non-Medical): No  Physical Activity: Not on file  Stress: Not on file  Social Connections: Not on file  Intimate Partner Violence: Not on file    ROS      Objective    There were no vitals taken for this visit.  Physical Exam      Assessment & Plan:   Patient seen with {IMTSattending2025/2026:32924}.  Problem List Items Addressed This Visit   None   No follow-ups on file.   Yulian Gosney, DO Internal Medicine Resident, PGY-1 8:59 PM 06/28/2024

## 2024-06-29 ENCOUNTER — Ambulatory Visit: Payer: Self-pay

## 2024-06-29 ENCOUNTER — Other Ambulatory Visit: Payer: Self-pay

## 2024-06-29 VITALS — BP 120/74 | HR 82 | Temp 98.0°F | Ht 65.0 in | Wt 121.4 lb

## 2024-06-29 DIAGNOSIS — E782 Mixed hyperlipidemia: Secondary | ICD-10-CM

## 2024-06-29 DIAGNOSIS — Z79899 Other long term (current) drug therapy: Secondary | ICD-10-CM

## 2024-06-29 DIAGNOSIS — F418 Other specified anxiety disorders: Secondary | ICD-10-CM

## 2024-06-29 DIAGNOSIS — I10 Essential (primary) hypertension: Secondary | ICD-10-CM

## 2024-06-29 MED ORDER — HYDROXYZINE HCL 10 MG PO TABS
10.0000 mg | ORAL_TABLET | Freq: Three times a day (TID) | ORAL | 3 refills | Status: AC | PRN
Start: 2024-06-29 — End: ?
  Filled 2024-06-29: qty 30, 10d supply, fill #0

## 2024-06-29 MED ORDER — PROPRANOLOL HCL 10 MG PO TABS
10.0000 mg | ORAL_TABLET | Freq: Two times a day (BID) | ORAL | 11 refills | Status: AC | PRN
Start: 2024-06-29 — End: 2025-06-24
  Filled 2024-06-29: qty 60, 30d supply, fill #0

## 2024-06-29 MED ORDER — BUSPIRONE HCL 7.5 MG PO TABS
7.5000 mg | ORAL_TABLET | Freq: Two times a day (BID) | ORAL | 3 refills | Status: DC
  Filled 2024-06-29: qty 60, 30d supply, fill #0

## 2024-06-29 NOTE — Assessment & Plan Note (Addendum)
 Patient presents today from [facility] to establish care, as she would prefer to be with [facility]. Currently seeing psychiatry and a therapist for her MDD and anxiety.  PHQ-9 18 indicating moderate to severe depression, GAD7 score 15, working severe anxiety today. She was recently assessed at [facility] for a annual visit with a presenting concern of body paresthesias. Vitamin B12, TSH, CBC, and CMP were all drawn in 05/2024 and all within normal limits. She is unsure why she keeps getting these tingling sensations, but notes that she has them whenever she feels anxious.  She will feel them in her bilateral arms into her neck and in her face and mouth.  This persistent, severe anxiety since December 2024, and thinks that it is work related, however has not had an inciting factor for her anxiety to have worsened in this time frame.  She is currently on escitalopram 20 mg daily, which she does not believe is working effectively enough for her.  Takes trazodone 50 mg nightly to help her sleep, which she states helps some nights but not others. She has trialed sertraline, fluoxetine, and bupropion throughout the course of 2025, all without improvement of her symptoms. She notes her functional status is worsening, is unable to do errands by herself because she becomes very anxious.  Patient already established with psychiatry and therapist. Discussed with patient the importance of discussing with psychiatry how she feels her medications are not sufficient for her currently.  Inquired if her anxiety or depression are more troublesome to her currently, and she feels as though her anxiety exacerbates her depressive symptoms.  Whenever her anxiety flares, she feels  unwell and has no energy to do anything throughout the day.  She will also get palpitations, sweats, and sometimes feel as though the room is spinning. Denies any shortness of breath.  Discussed addition of BuSpar 7.5 twice daily to help with anxiety, propranolol for panic  attacks, and switching her tramadol 50 to hydroxyzine 10 mg as needed for sleep.  She is to take the BuSpar in conjunction with her escitalopram to help both her anxious and depressive symptoms, and to schedule an appointment with psychiatry for further management.  I agree with the patient, her anxiety seems to be the predominant factor in her symptoms over her depression, which is likely causing her paresthesias.  - Will follow up in 1 month to address improvement in symptoms - Will start Buspar 7.5mg  BID, Propanolol 10mg  PRN, and Hydroxyzine 10mg  tablets for sleep - Stop Trazodone 50mg  for sleep

## 2024-07-11 NOTE — Progress Notes (Signed)
 Internal Medicine Clinic Attending  Case discussed with the resident at the time of the visit.  We reviewed the resident's history and exam and pertinent patient test results.  I agree with the assessment, diagnosis, and plan of care documented in the resident's note.

## 2024-08-09 ENCOUNTER — Ambulatory Visit: Payer: Self-pay | Admitting: Student

## 2024-08-09 ENCOUNTER — Other Ambulatory Visit: Payer: Self-pay

## 2024-08-09 VITALS — BP 124/75 | HR 76 | Temp 97.7°F | Ht 65.0 in | Wt 123.2 lb

## 2024-08-09 DIAGNOSIS — Z1211 Encounter for screening for malignant neoplasm of colon: Secondary | ICD-10-CM

## 2024-08-09 DIAGNOSIS — F418 Other specified anxiety disorders: Secondary | ICD-10-CM

## 2024-08-09 MED ORDER — BUSPIRONE HCL 7.5 MG PO TABS
7.5000 mg | ORAL_TABLET | Freq: Two times a day (BID) | ORAL | 3 refills | Status: AC
  Filled 2024-08-09: qty 60, 30d supply, fill #0

## 2024-08-09 NOTE — Progress Notes (Unsigned)
 CC: Follow up on anxiety and depression   HPI:  Nancy Melton is a 53 y.o. female living with a history stated below and presents today for follow-up on her anxiety and depression. Please see problem based assessment and plan for additional details.  Past Medical History:  Diagnosis Date   High cholesterol    Seasonal allergies     Current Outpatient Medications on File Prior to Visit  Medication Sig Dispense Refill   escitalopram (LEXAPRO) 20 MG tablet Take 20 mg by mouth.     hydrOXYzine  (ATARAX ) 10 MG tablet Take 1 tablet (10 mg total) by mouth 3 (three) times daily as needed. 30 tablet 3   lisinopril (ZESTRIL) 10 MG tablet Take 10 mg by mouth daily.     lovastatin (MEVACOR) 10 MG tablet Take 10 mg by mouth at bedtime. (Patient not taking: Reported on 06/08/2024)     propranolol  (INDERAL ) 10 MG tablet Take 1 tablet (10 mg total) by mouth 2 (two) times daily as needed. 60 tablet 11   VITAMIN D , ERGOCALCIFEROL , PO i tab po qd     No current facility-administered medications on file prior to visit.    Family History  Problem Relation Age of Onset   Diabetes Brother    Breast cancer Neg Hx     Social History   Socioeconomic History   Marital status: Single    Spouse name: Not on file   Number of children: 1   Years of education: Not on file   Highest education level: Professional school degree (e.g., MD, DDS, DVM, JD)  Occupational History   Not on file  Tobacco Use   Smoking status: Never   Smokeless tobacco: Never  Vaping Use   Vaping status: Never Used  Substance and Sexual Activity   Alcohol use: Not Currently   Drug use: Never   Sexual activity: Not Currently    Birth control/protection: Post-menopausal  Other Topics Concern   Not on file  Social History Narrative   Not on file   Social Drivers of Health   Financial Resource Strain: Not on file  Food Insecurity: No Food Insecurity (06/29/2024)   Hunger Vital Sign    Worried About  Running Out of Food in the Last Year: Never true    Ran Out of Food in the Last Year: Never true  Transportation Needs: No Transportation Needs (06/29/2024)   PRAPARE - Administrator, Civil Service (Medical): No    Lack of Transportation (Non-Medical): No  Physical Activity: Insufficiently Active (06/29/2024)   Exercise Vital Sign    Days of Exercise per Week: 2 days    Minutes of Exercise per Session: 20 min  Stress: Not on file  Social Connections: Moderately Integrated (06/29/2024)   Social Connection and Isolation Panel    Frequency of Communication with Friends and Family: More than three times a week    Frequency of Social Gatherings with Friends and Family: Three times a week    Attends Religious Services: More than 4 times per year    Active Member of Clubs or Organizations: Yes    Attends Banker Meetings: More than 4 times per year    Marital Status: Never married  Intimate Partner Violence: Not At Risk (06/29/2024)   Humiliation, Afraid, Rape, and Kick questionnaire    Fear of Current or Ex-Partner: No    Emotionally Abused: No    Physically Abused: No    Sexually Abused: No  Review of Systems: ROS negative except for what is noted on the assessment and plan.  Vitals:   08/09/24 1100 08/09/24 1148  BP: (!) 136/91 124/75  Pulse: 98 76  Temp: 97.7 F (36.5 C)   TempSrc: Oral   SpO2: 98%   Weight: 123 lb 3.2 oz (55.9 kg)   Height: 5' 5 (1.651 m)     Physical Exam: Constitutional: well-appearing woman, sitting in chair, in no acute distress HENT: normocephalic atraumatic, mucous membranes moist Eyes: conjunctiva non-erythematous Cardiovascular: regular rate and rhythm, no m/r/g Pulmonary/Chest: normal work of breathing on room air, lungs clear to auscultation bilaterally Abdominal: soft, non-tender, non-distended MSK: normal bulk and tone Neurological: alert & oriented x 3, no focal deficit Skin: warm and dry Psych: normal mood  and behavior  Assessment & Plan:   Mixed anxiety and depressive disorder The patient returns to the clinic for follow-up of her mixed anxiety and depressive disorder. During her last visit, she was started on Buspirone  7.5 mg BID, Propranolol  10 mg PRN, and Hydroxyzine  10 mg for sleep, and her trazodone  was discontinued. Today, her PHQ-9 score is 13 (previously 18) and her GAD-7 score is 13 (previously 15). She reports taking her medications only when she feels it is convenient rather than as prescribed. I explored her reasons, and she expresses a preference for conservative, non-pharmacologic methods of managing her symptoms. She reports taking propranolol  during panic attacks and using hydroxyzine  only sparingly. She states she  takes her buspirone , acknowledging that it often helps her. Her selective and inconsistent use of medications complicates the ability to assess efficacy and determine appropriate adjustments. I spent approximately 10 minutes counseling her on medication adherence, the importance of consistent use, and reviewing evidence-based recommendations supporting her treatment plan. She verbalized partial agreement and stated she would try to take her medications as prescribed. I agree with Dr. Dudley plan and will continue her current medication regimen at this time. She will benefit from routine follow-up, and it is appropriate for her to return in one month for reassessment. - Refill her BuSpar   7.5 mg - Continue propranolol  10 mg as needed -Continuing hydroxyzine  10 mg  Screening for colon cancer The patient agreed to have a colonoscopy done for colon cancer screening. -Ambulatory referral to gastroenterology sent     Patient discussed with Dr. Francesco Drue Grow, M.D [facility] Internal Medicine Phone: [phone] Date 08/10/2024 Time 11:44 AM

## 2024-08-09 NOTE — Patient Instructions (Addendum)
  Gracias, Sra. Nancy Melton, por permitirnos atenderla hoy. Hoy hablamos sobre su depresin o ansiedad. Me alegra que los medicamentos le estn funcionando.  Contine con el ejercicio y los cambios de estilo de vida que est haciendo. Si tiene alguna otra inquietud, no dude en informarnos.   I have ordered the following labs for you:  Lab Orders  No laboratory test(s) ordered today     Tests ordered today:    Referrals ordered today:   Referral Orders  No referral(s) requested today     I have ordered the following medication/changed the following medications:   Stop the following medications: There are no discontinued medications.   Start the following medications: No orders of the defined types were placed in this encounter.    Follow up:  Remember:   Should you have any questions or concerns please call the internal medicine clinic at [phone].   Drue Lisa Grow MD 08/09/2024, 12:03 PM   [facility]

## 2024-08-09 NOTE — Progress Notes (Unsigned)
 Patient name: Nancy Melton Date of birth: April 07, 1971 Date of visit: 08/09/24  Type of visit: Established Patient Office Visit   Subjective   Chief concern: No chief complaint on file.   Nancy Melton is a 53 y.o. female with a history of hypertension, dyslipidemia, anxiety, and depression who presents to [facility] clinic for 1 month recheck regarding anxiety and depression.  She was last seen in the [facility] on 06/25/2024 by Dr. Isobel.  At that time she had a significant PHQ-9 score of 18 and a GAD-7 score of 15.  She is currently seeing psychiatry and a cognitive behavioral therapist for her MDD and anxiety.  She has trialed sertraline, fluoxetine, and bupropion throughout the course of 2025 without improvement of her symptoms.  She was taking escitalopram 20 mg daily, which she began in ***and reported she did not feel this was working.  She was also previously taking trazodone  50 mg nightly to help with her sleep. At that visit they discussed continuing escitalopram 20 mg daily, adding BuSpar  7.5 mg twice daily, propranolol  10 mg as needed, and hydroxyzine  10 mg for sleep.  She was instructed to stop the trazodone .  ROS: Negative unless otherwise listed in the HPI.  Patient Active Problem List   Diagnosis Date Noted   Plantar fasciitis 11/09/2021   Dyslipidemia 06/05/2020   Menopause present 06/05/2020   Mixed anxiety and depressive disorder 06/05/2020   Depressive disorder 02/03/2020     Past Surgical History:  Procedure Laterality Date   BREAST BIOPSY Left 08/06/2021   fibroadenoma   BREAST BIOPSY Right 09/08/2022   MM RT BREAST BX W LOC DEV 1ST LESION IMAGE BX SPEC STEREO GUIDE 09/08/2022 [facility]   BREAST BIOPSY  12/13/2022   MM RT RADIOACTIVE SEED LOC MAMMO GUIDE 12/13/2022 [facility]   BREAST LUMPECTOMY WITH RADIOACTIVE SEED LOCALIZATION Right 12/14/2022   Procedure: RIGHT BREAST LUMPECTOMY WITH RADIOACTIVE SEED LOCALIZATION;  Surgeon: Vanderbilt Ned, MD;  Location: [facility];  Service: General;  Laterality: Right;   EYE SURGERY       Current Outpatient Medications  Medication Instructions   busPIRone  (BUSPAR ) 7.5 mg, Oral, 2 times daily   escitalopram (LEXAPRO) 20 mg   hydrOXYzine  (ATARAX ) 10 mg, Oral, 3 times daily PRN   lisinopril (ZESTRIL) 10 mg, Daily   lovastatin (MEVACOR) 10 mg, Daily at bedtime   propranolol  (INDERAL ) 10 mg, Oral, 2 times daily PRN   VITAMIN D , ERGOCALCIFEROL , PO i tab po qd    Social History   Tobacco Use   Smoking status: Never   Smokeless tobacco: Never  Vaping Use   Vaping status: Never Used  Substance Use Topics   Alcohol use: Not Currently   Drug use: Never      Objective  There were no vitals filed for this visit.There is no height or weight on file to calculate BMI.   Physical Exam:   Constitutional: well-appearing *** sitting in exam chair, in no acute distress. Ambulates without use of assistance device *** HEENT: normocephalic atraumatic, mucous membranes moist Eyes: conjunctiva non-erythematous Neck: supple Cardiovascular: regular rate and rhythm, bilateral radial pulses 2+, bilateral dorsal pedal pulses 2+, brisk capillary refill bilateral feet and hands  Pulmonary/Chest: normal work of breathing on room air, lungs clear to auscultation bilaterally Abdominal: soft, non-tender, non-distended MSK: normal bulk and tone. Neurological: alert & oriented x 3, sensation intact bilateral feet to monofilament*** Skin: warm and dry, no ulcers or lesions on bilateral feet*** Psych:  mood calm, behavior normal, thought content normal, judgement normal    {Labs (Optional):23779}  The 10-year ASCVD risk score (Arnett DK, et al., 2019) is: 1.6%   Values used to calculate the score:     Age: 87 years     Clincally relevant sex: Female     Is Non-Hispanic African American: No     Diabetic: No     Tobacco smoker: No     Systolic Blood Pressure: 120 mmHg     Is BP  treated: Yes     HDL Cholesterol: 61 mg/dL     Total Cholesterol: 201 mg/dL      Assessment & Plan  Problem List Items Addressed This Visit   None   No follow-ups on file.  Patient discussed with Dr. Francesco, who also saw and evaluated the patient.  Doyal Miyamoto, MD [facility]  PGY-1 08/09/2024, 10:27 AM

## 2024-08-10 DIAGNOSIS — Z1211 Encounter for screening for malignant neoplasm of colon: Secondary | ICD-10-CM | POA: Insufficient documentation

## 2024-08-10 NOTE — Assessment & Plan Note (Addendum)
 The patient returns to the clinic for follow-up of her mixed anxiety and depressive disorder. During her last visit, she was started on Buspirone  7.5 mg BID, Propranolol  10 mg PRN, and Hydroxyzine  10 mg for sleep, and her trazodone  was discontinued. Today, her PHQ-9 score is 13 (previously 18) and her GAD-7 score is 13 (previously 15). She reports taking her medications only when she feels it is convenient rather than as prescribed. I explored her reasons, and she expresses a preference for conservative, non-pharmacologic methods of managing her symptoms. She reports taking propranolol  during panic attacks and using hydroxyzine  only sparingly. She states she  takes her buspirone , acknowledging that it often helps her. Her selective and inconsistent use of medications complicates the ability to assess efficacy and determine appropriate adjustments. I spent approximately 10 minutes counseling her on medication adherence, the importance of consistent use, and reviewing evidence-based recommendations supporting her treatment plan. She verbalized partial agreement and stated she would try to take her medications as prescribed. I agree with Dr. Dudley plan and will continue her current medication regimen at this time. She will benefit from routine follow-up, and it is appropriate for her to return in one month for reassessment. - Refill her BuSpar   7.5 mg - Continue propranolol  10 mg as needed -Continuing hydroxyzine  10 mg

## 2024-08-10 NOTE — Progress Notes (Signed)
 Internal Medicine Clinic Attending  Case discussed with the resident at the time of the visit.  We reviewed the resident's history and exam and pertinent patient test results.  I agree with the assessment, diagnosis, and plan of care documented in the resident's note.

## 2024-08-10 NOTE — Assessment & Plan Note (Signed)
 The patient agreed to have a colonoscopy done for colon cancer screening. -Ambulatory referral to gastroenterology sent

## 2024-09-25 ENCOUNTER — Encounter: Payer: Self-pay | Admitting: Neurology

## 2024-09-25 ENCOUNTER — Ambulatory Visit (INDEPENDENT_AMBULATORY_CARE_PROVIDER_SITE_OTHER): Payer: Self-pay | Admitting: Neurology

## 2024-09-25 VITALS — BP 138/86 | HR 88 | Ht 65.0 in | Wt 124.0 lb

## 2024-09-25 DIAGNOSIS — R200 Anesthesia of skin: Secondary | ICD-10-CM

## 2024-09-25 DIAGNOSIS — R202 Paresthesia of skin: Secondary | ICD-10-CM

## 2024-09-25 NOTE — Progress Notes (Signed)
 [facility] Clinic Note - Initial Visit   Date: 09/25/2024   Nancy Melton MRN: [id number] DOB: 1971/02/04   Dear Jereld Serum, NP:  Thank you for your kind referral of Nancy Melton for consultation of bilateral arm tingling. Although her history is well known to you, please allow us  to reiterate it for the purpose of our medical record. The patient was accompanied to the clinic by self.    Nancy Melton is a 54 y.o. right-handed female with hypertension, hyperlipidemia, and anxiety/depression presenting for evaluation of bilateral arm tingling.   IMPRESSION/PLAN: Bilateral upper extremity paresthesias.  ?entrapment neuropathy vs cervical radiculopathy.  She has mild Tinel's sign at the left wrist.    - NCS/EMG of the arms  Facial and oral paresthesias.  Vitamin B12 and TSH is normal.   - MRI brain wwo contrast  Further recommendations pending results.   ------------------------------------------------------------- History of present illness: Starting around early 2025, she began having tingling involving the hands and arms.  She does not wake up with hands falling asleep. She gets ery nervous and has cold sensation over her face and fingers feet hot.  She also reports that hands ache.  She works at [employer] and using her hands a lot.    Out-side paper records, electronic medical record, and images have been reviewed where available and summarized as:  Lab Results  Component Value Date   HGBA1C 5.6 03/14/2024   Lab Results  Component Value Date   VITAMINB12 706 05/25/2024   Lab Results  Component Value Date   TSH 0.95 05/25/2024    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   BREAST BIOPSY Right 09/08/2022   MM RT BREAST BX W LOC DEV 1ST LESION IMAGE BX  SPEC STEREO GUIDE 09/08/2022 [facility]   BREAST BIOPSY  12/13/2022   MM RT RADIOACTIVE SEED LOC MAMMO GUIDE 12/13/2022 [facility]   BREAST LUMPECTOMY WITH RADIOACTIVE SEED LOCALIZATION Right 12/14/2022   Procedure: RIGHT BREAST LUMPECTOMY WITH RADIOACTIVE SEED LOCALIZATION;  Surgeon: Vanderbilt Ned, MD;  Location: [facility];  Service: General;  Laterality: Right;   EYE SURGERY       Medications:  Outpatient Encounter Medications as of 09/25/2024  Medication Sig   busPIRone  (BUSPAR ) 7.5 MG tablet Take 1 tablet (7.5 mg total) by mouth 2 (two) times daily. (Patient taking differently: Take 7.5 mg by mouth every morning.)   escitalopram (LEXAPRO) 20 MG tablet Take 20 mg by mouth.   hydrOXYzine  (ATARAX ) 10 MG tablet Take 1 tablet (10 mg total) by mouth 3 (three) times daily as needed. (Patient not taking: Reported on 09/25/2024)   lisinopril (ZESTRIL) 10 MG tablet Take 10 mg by mouth daily. (Patient not taking: Reported on 09/25/2024)   lovastatin (MEVACOR) 10 MG tablet Take 10 mg by mouth at bedtime. (Patient not taking: Reported on 09/25/2024)   propranolol  (INDERAL ) 10 MG tablet Take 1 tablet (10 mg total) by mouth 2 (two) times daily as needed. (Patient not taking: Reported on 09/25/2024)   VITAMIN D , ERGOCALCIFEROL , PO i tab po qd (Patient not taking: Reported on 09/25/2024)   No facility-administered encounter medications on file as of 09/25/2024.    Allergies: Allergies[1]  Family History: Family History  Problem Relation Age of Onset   Diabetes Brother  Breast cancer Neg Hx     Social History: Social History[2] Social History   Social History Narrative   Are you right handed or left handed? Right handed   Are you currently employed ? yes   What is your current occupation?   Do you live at home alone? no   Who lives with you?    What type of home do you live in: 1 story or 2 story? Lives in a one story home        Vital Signs:  BP 138/86   Pulse  88   Ht 5' 5 (1.651 m)   Wt 124 lb (56.2 kg)   SpO2 99%   BMI 20.63 kg/m    Neurological Exam: MENTAL STATUS including orientation to time, place, person, recent and remote memory, attention span and concentration, language, and fund of knowledge is normal.  Speech is not dysarthric.  CRANIAL NERVES: II:  No visual field defects.     III-IV-VI: Pupils equal round and reactive to light.  Normal conjugate, extra-ocular eye movements in all directions of gaze.  No nystagmus.  No ptosis.   V:  Normal facial sensation.    VII:  Normal facial symmetry and movements.   VIII:  Normal hearing and vestibular function.   IX-X:  Normal palatal movement.   XI:  Normal shoulder shrug and head rotation.   XII:  Normal tongue strength and range of motion, no deviation or fasciculation.  MOTOR:  No atrophy, fasciculations or abnormal movements.  No pronator drift.   Upper Extremity:  Right  Left  Deltoid  5/5   5/5   Biceps  5/5   5/5   Triceps  5/5   5/5   Wrist extensors  5/5   5/5   Wrist flexors  5/5   5/5   Finger extensors  5/5   5/5   Finger flexors  5/5   5/5   Dorsal interossei  5/5   5/5   Abductor pollicis  5/5   5/5   Tone (Ashworth scale)  0  0   Lower Extremity:  Right  Left  Hip flexors  5/5   5/5   Knee flexors  5/5   5/5   Knee extensors  5/5   5/5   Dorsiflexors  5/5   5/5   Plantarflexors  5/5   5/5   Toe extensors  5/5   5/5   Toe flexors  5/5   5/5   Tone (Ashworth scale)  0  0   MSRs:                                           Right        Left brachioradialis 2+  2+  biceps 2+  2+  triceps 2+  2+  patellar 2+  3+  ankle jerk 2+  2+  Hoffman no  no  plantar response down  down   SENSORY:  Normal and symmetric perception of light touch, pinprick, vibration, and temperature.  Romberg's sign absent.  Tinel's sign is mildly positive at the left wrist.   COORDINATION/GAIT: Normal finger-to- nose-finger.  Intact rapid alternating movements bilaterally.  Able to  rise from a chair without using arms.  Gait narrow based and stable. Tandem and stressed gait intact.     Thank you for allowing me to participate in  patient's care.  If I can answer any additional questions, I would be pleased to do so.    Sincerely,    Eliodoro Gullett K. Karletta Millay, DO     [1]  Allergies Allergen Reactions   Other Other (See Comments)   Pollen Extract     Other Reaction(s): Other (See Comments)   Gramineae Pollens    Latex Hives  [2]  Social History Tobacco Use   Smoking status: Never   Smokeless tobacco: Never  Vaping Use   Vaping status: Never Used  Substance Use Topics   Alcohol use: Not Currently   Drug use: Never   "

## 2024-09-25 NOTE — Patient Instructions (Addendum)
 MRI brain   Nerve testing of the arms  ELECTROMYOGRAM AND NERVE CONDUCTION STUDIES (EMG/NCS) INSTRUCTIONS  How to Prepare The neurologist conducting the EMG will need to know if you have certain medical conditions. Tell the neurologist and other EMG lab personnel if you: Have a pacemaker or any other electrical medical device Take blood-thinning medications Have hemophilia, a blood-clotting disorder that causes prolonged bleeding Bathing Take a shower or bath shortly before your exam in order to remove oils from your skin. Dont apply lotions or creams before the exam.  What to Expect Youll likely be asked to change into a hospital gown for the procedure and lie down on an examination table. The following explanations can help you understand what will happen during the exam.  Electrodes. The neurologist or a technician places surface electrodes at various locations on your skin depending on where youre experiencing symptoms. Or the neurologist may insert needle electrodes at different sites depending on your symptoms.  Sensations. The electrodes will at times transmit a tiny electrical current that you may feel as a twinge or spasm. The needle electrode may cause discomfort or pain that usually ends shortly after the needle is removed. If you are concerned about discomfort or pain, you may want to talk to the neurologist about taking a short break during the exam.  Instructions. During the needle EMG, the neurologist will assess whether there is any spontaneous electrical activity when the muscle is at rest - activity that isnt present in healthy muscle tissue - and the degree of activity when you slightly contract the muscle.  He or she will give you instructions on resting and contracting a muscle at appropriate times. Depending on what muscles and nerves the neurologist is examining, he or she may ask you to change positions during the exam.  After your EMG You may experience some  temporary, minor bruising where the needle electrode was inserted into your muscle. This bruising should fade within several days. If it persists, contact your primary care doctor.

## 2024-10-06 ENCOUNTER — Ambulatory Visit (HOSPITAL_COMMUNITY): Admission: RE | Admit: 2024-10-06 | Payer: Self-pay | Source: Ambulatory Visit

## 2024-10-13 ENCOUNTER — Ambulatory Visit (HOSPITAL_COMMUNITY): Payer: Self-pay

## 2024-11-22 ENCOUNTER — Encounter: Payer: Self-pay | Admitting: Neurology
